# Patient Record
Sex: Male | Born: 1980 | Race: Black or African American | Hispanic: No | Marital: Single | Smoking: Former smoker
Health system: Southern US, Community
[De-identification: ages and names within clinical notes are randomized; demographics above are authoritative.]

## PROBLEM LIST (undated history)

## (undated) DIAGNOSIS — F419 Anxiety disorder, unspecified: Secondary | ICD-10-CM

## (undated) DIAGNOSIS — G4730 Sleep apnea, unspecified: Secondary | ICD-10-CM

## (undated) DIAGNOSIS — F32A Depression, unspecified: Secondary | ICD-10-CM

## (undated) DIAGNOSIS — K219 Gastro-esophageal reflux disease without esophagitis: Secondary | ICD-10-CM

## (undated) DIAGNOSIS — F1020 Alcohol dependence, uncomplicated: Secondary | ICD-10-CM

## (undated) HISTORY — PX: NO PAST SURGERIES: SHX2092

## (undated) HISTORY — DX: Alcohol dependence, uncomplicated: F10.20

## (undated) HISTORY — DX: Anxiety disorder, unspecified: F41.9

## (undated) HISTORY — DX: Sleep apnea, unspecified: G47.30

## (undated) HISTORY — DX: Depression, unspecified: F32.A

---

## 2003-02-07 ENCOUNTER — Emergency Department (HOSPITAL_COMMUNITY): Admission: EM | Admit: 2003-02-07 | Discharge: 2003-02-07 | Payer: Self-pay | Admitting: Emergency Medicine

## 2003-02-07 ENCOUNTER — Encounter: Payer: Self-pay | Admitting: Emergency Medicine

## 2003-04-28 ENCOUNTER — Encounter: Payer: Self-pay | Admitting: Emergency Medicine

## 2003-04-28 ENCOUNTER — Emergency Department (HOSPITAL_COMMUNITY): Admission: EM | Admit: 2003-04-28 | Discharge: 2003-04-28 | Payer: Self-pay | Admitting: Emergency Medicine

## 2003-07-03 ENCOUNTER — Emergency Department (HOSPITAL_COMMUNITY): Admission: AD | Admit: 2003-07-03 | Discharge: 2003-07-03 | Payer: Self-pay | Admitting: Emergency Medicine

## 2005-01-04 ENCOUNTER — Emergency Department (HOSPITAL_COMMUNITY): Admission: EM | Admit: 2005-01-04 | Discharge: 2005-01-04 | Payer: Self-pay | Admitting: Family Medicine

## 2005-10-31 ENCOUNTER — Emergency Department (HOSPITAL_COMMUNITY): Admission: EM | Admit: 2005-10-31 | Discharge: 2005-10-31 | Payer: Self-pay | Admitting: Emergency Medicine

## 2006-02-14 ENCOUNTER — Emergency Department (HOSPITAL_COMMUNITY): Admission: EM | Admit: 2006-02-14 | Discharge: 2006-02-14 | Payer: Self-pay | Admitting: Emergency Medicine

## 2007-01-04 ENCOUNTER — Emergency Department (HOSPITAL_COMMUNITY): Admission: EM | Admit: 2007-01-04 | Discharge: 2007-01-04 | Payer: Self-pay | Admitting: *Deleted

## 2009-06-26 ENCOUNTER — Emergency Department (HOSPITAL_COMMUNITY): Admission: EM | Admit: 2009-06-26 | Discharge: 2009-06-26 | Payer: Self-pay | Admitting: Emergency Medicine

## 2010-01-23 ENCOUNTER — Emergency Department (HOSPITAL_COMMUNITY): Admission: EM | Admit: 2010-01-23 | Discharge: 2010-01-23 | Payer: Self-pay | Admitting: Emergency Medicine

## 2010-01-24 ENCOUNTER — Emergency Department (HOSPITAL_COMMUNITY): Admission: EM | Admit: 2010-01-24 | Discharge: 2010-01-24 | Payer: Self-pay | Admitting: Emergency Medicine

## 2010-11-19 LAB — CBC
HCT: 46.5 % (ref 39.0–52.0)
Hemoglobin: 15.6 g/dL (ref 13.0–17.0)
MCHC: 33.5 g/dL (ref 30.0–36.0)
MCV: 84.5 fL (ref 78.0–100.0)
Platelets: 200 10*3/uL (ref 150–400)
RBC: 5.49 MIL/uL (ref 4.22–5.81)
RDW: 12.5 % (ref 11.5–15.5)
WBC: 9.9 10*3/uL (ref 4.0–10.5)

## 2010-11-19 LAB — DIFFERENTIAL
Basophils Absolute: 0 10*3/uL (ref 0.0–0.1)
Basophils Relative: 0 % (ref 0–1)
Eosinophils Absolute: 0.1 10*3/uL (ref 0.0–0.7)
Eosinophils Relative: 1 % (ref 0–5)
Lymphocytes Relative: 17 % (ref 12–46)
Lymphs Abs: 1.7 10*3/uL (ref 0.7–4.0)
Monocytes Absolute: 0.4 10*3/uL (ref 0.1–1.0)
Monocytes Relative: 4 % (ref 3–12)
Neutro Abs: 7.6 10*3/uL (ref 1.7–7.7)
Neutrophils Relative %: 78 % — ABNORMAL HIGH (ref 43–77)

## 2010-11-19 LAB — URINALYSIS, ROUTINE W REFLEX MICROSCOPIC
Bilirubin Urine: NEGATIVE
Glucose, UA: NEGATIVE mg/dL
Hgb urine dipstick: NEGATIVE
Ketones, ur: 15 mg/dL — AB
Nitrite: NEGATIVE
Protein, ur: NEGATIVE mg/dL
Specific Gravity, Urine: 1.028 (ref 1.005–1.030)
Urobilinogen, UA: 0.2 mg/dL (ref 0.0–1.0)
pH: 5 (ref 5.0–8.0)

## 2011-12-27 ENCOUNTER — Encounter (HOSPITAL_COMMUNITY): Payer: Self-pay | Admitting: *Deleted

## 2011-12-27 ENCOUNTER — Emergency Department (HOSPITAL_COMMUNITY)
Admission: EM | Admit: 2011-12-27 | Discharge: 2011-12-27 | Discharge status: Absent without leave | Payer: Self-pay | Source: Home / Self Care

## 2011-12-27 ENCOUNTER — Emergency Department (HOSPITAL_COMMUNITY)
Admission: EM | Admit: 2011-12-27 | Discharge: 2011-12-27 | Disposition: A | Discharge status: Home / Self Care | Payer: Self-pay | Attending: Emergency Medicine | Admitting: Emergency Medicine

## 2011-12-27 DIAGNOSIS — R1013 Epigastric pain: Secondary | ICD-10-CM | POA: Insufficient documentation

## 2011-12-27 DIAGNOSIS — K2990 Gastroduodenitis, unspecified, without bleeding: Secondary | ICD-10-CM | POA: Insufficient documentation

## 2011-12-27 DIAGNOSIS — K2970 Gastritis, unspecified, without bleeding: Secondary | ICD-10-CM | POA: Insufficient documentation

## 2011-12-27 LAB — CBC
HCT: 43.3 % (ref 39.0–52.0)
Hemoglobin: 14.5 g/dL (ref 13.0–17.0)
MCHC: 33.5 g/dL (ref 30.0–36.0)
RBC: 5.19 MIL/uL (ref 4.22–5.81)
RDW: 12.8 % (ref 11.5–15.5)

## 2011-12-27 LAB — URINALYSIS, ROUTINE W REFLEX MICROSCOPIC
Protein, ur: NEGATIVE mg/dL
Urobilinogen, UA: 1 mg/dL (ref 0.0–1.0)

## 2011-12-27 LAB — COMPREHENSIVE METABOLIC PANEL
CO2: 25 mEq/L (ref 19–32)
Calcium: 9.5 mg/dL (ref 8.4–10.5)
Chloride: 104 mEq/L (ref 96–112)
Creatinine, Ser: 1.11 mg/dL (ref 0.50–1.35)
GFR calc Af Amer: >90 mL/min (ref >90)
GFR calc non Af Amer: 88 mL/min — ABNORMAL LOW (ref >90)
Glucose, Bld: 102 mg/dL — ABNORMAL HIGH (ref 70–99)
Sodium: 139 mEq/L (ref 135–145)

## 2011-12-27 LAB — DIFFERENTIAL
Basophils Absolute: 0.1 10*3/uL (ref 0.0–0.1)
Lymphocytes Relative: 33 % (ref 12–46)
Lymphs Abs: 2.8 10*3/uL (ref 0.7–4.0)
Neutro Abs: 4.8 10*3/uL (ref 1.7–7.7)
Neutrophils Relative %: 56 % (ref 43–77)

## 2011-12-27 LAB — URINE MICROSCOPIC-ADD ON

## 2011-12-27 LAB — LIPASE, BLOOD: Lipase: 64 U/L — ABNORMAL HIGH (ref 11–59)

## 2011-12-27 MED ORDER — OMEPRAZOLE 20 MG PO CPDR: 20.0000 mg | DELAYED_RELEASE_CAPSULE | Freq: Every day | ORAL | Status: DC

## 2011-12-27 MED ORDER — FUROSEMIDE 40 MG PO TABS: 40.0000 mg | ORAL_TABLET | Freq: Every day | ORAL | Status: DC

## 2011-12-27 NOTE — Discharge Instructions (Signed)
Gastritis Gastritis is an inflammation (the body's way of reacting to injury and/or infection) of the stomach. It is often caused by viral or bacterial (germ) infections. It can also be caused by chemicals (including alcohol) and medications. This illness may be associated with generalized malaise (feeling tired, not well), cramps, and fever. The illness may last 2 to 7 days. If symptoms of gastritis continue, gastroscopy (looking into the stomach with a telescope-like instrument), biopsy (taking tissue samples), and/or blood tests may be necessary to determine the cause. Antibiotics will not affect the illness unless there is a bacterial infection present. One common bacterial cause of gastritis is an organism known as H. Pylori. This can be treated with antibiotics. Other forms of gastritis are caused by too much acid in the stomach. They can be treated with medications such as H2 blockers and antacids. Home treatment is usually all that is needed. Young children will quickly become dehydrated (loss of body fluids) if vomiting and diarrhea are both present. Medications may be given to control nausea. Medications are usually not given for diarrhea unless especially bothersome. Some medications slow the removal of the virus from the gastrointestinal tract. This slows down the healing process. HOME CARE INSTRUCTIONS Home care instructions for nausea and vomiting:  For adults: drink small amounts of fluids often. Drink at least 2 quarts a day. Take sips frequently. Do not drink large amounts of fluid at one time. This may worsen the nausea.   Only take over-the-counter or prescription medicines for pain, discomfort, or fever as directed by your caregiver.   Drink clear liquids only. Those are anything you can see through such as water, broth, or soft drinks.   Once you are keeping clear liquids down, you may start full liquids, soups, juices, and ice cream or sherbet. Slowly add bland (plain, not spicy)  foods to your diet.  Home care instructions for diarrhea:  Diarrhea can be caused by bacterial infections or a virus. Your condition should improve with time, rest, fluids, and/or anti-diarrheal medication.   Until your diarrhea is under control, you should drink clear liquids often in small amounts. Clear liquids include: water, broth, jell-o water and weak tea.  Avoid:  Milk.   Fruits.   Tobacco.   Alcohol.   Extremely hot or cold fluids.   Too much intake of anything at one time.  When your diarrhea stops you may add the following foods, which help the stool to become more formed:  Rice.   Bananas.   Apples without skin.   Dry toast.  Once these foods are tolerated you may add low-fat yogurt and low-fat cottage cheese. They will help to restore the normal bacterial balance in your bowel. Wash your hands well to avoid spreading bacteria (germ) or virus. SEEK IMMEDIATE MEDICAL CARE IF:   You are unable to keep fluids down.   Vomiting or diarrhea become persistent (constant).   Abdominal pain develops, increases, or localizes. (Right sided pain can be appendicitis. Left sided pain in adults can be diverticulitis.)   You develop a fever (an oral temperature above 102 F (38.9 C)).   Diarrhea becomes excessive or contains blood or mucus.   You have excessive weakness, dizziness, fainting or extreme thirst.   You are not improving or you are getting worse.   You have any other questions or concerns.  Document Released: 08/13/2001 Document Revised: 08/08/2011 Document Reviewed: 08/19/2005 Exit[employer] Patient Information 2012 [employer]. 

## 2011-12-27 NOTE — ED Provider Notes (Signed)
History     CSN: [id number]  Arrival date & time 12/27/11  1807   First MD Initiated Contact with Patient 12/27/11 2215      Chief Complaint  Patient presents with  . Abdominal Pain    (Consider location/radiation/quality/duration/timing/severity/associated sxs/prior treatment) The history is provided by the patient.   the patient's been having epigastric abdominal pain for several months.  Reports his discomfort worsen over the past 3-4 days.  His radiation towards his back.  He one episode of vomiting.  He denies hematemesis.  He denies melena or hematochezia.  He reports his symptoms are worsened by spicy foods and by drinking beer.  The symptoms are sometimes worsened by lying back.  He reports his pain feels similar to a burning sensation.  He reports "it seems raw".  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  The patient is currently trying Zantac at home and has not had any improvement in his symptoms.  He denies nausea and vomiting at this time.  He reports his discomfort in his abdomen is not present currently as it is there only there when he eats.  His symptoms are mild  History reviewed. No pertinent past medical history.  History reviewed. No pertinent past surgical history.  No family history on file.  History  Substance Use Topics  . Smoking status: Current Everyday Smoker  . Smokeless tobacco: Not on file  . Alcohol Use: Yes      Review of Systems  Gastrointestinal: Positive for abdominal pain.  All other systems reviewed and are negative.    Allergies  Review of patient's allergies indicates no known allergies.  Home Medications   Current Outpatient Rx  Name Route Sig Dispense Refill  . RANITIDINE HCL 150 MG PO TABS Oral Take 150 mg by mouth every morning.    . TETRAHYDROZOLINE HCL 0.05 % OP SOLN Both Eyes Place 2 drops into both eyes daily as needed. For allergies/itchy eyes    . OMEPRAZOLE 20 MG PO CPDR Oral Take 1 capsule (20  mg total) by mouth daily. 30 capsule 1    BP 131/78  Pulse 69  Temp(Src) 98.7 F (37.1 C) (Oral)  Resp 18  SpO2 100%  Physical Exam  Nursing note and vitals reviewed. Constitutional: He is oriented to person, place, and time. He appears well-developed and well-nourished.  HENT:  Head: Normocephalic and atraumatic.  Eyes: EOM are normal.  Neck: Normal range of motion.  Cardiovascular: Normal rate, regular rhythm, normal heart sounds and intact distal pulses.   Pulmonary/Chest: Effort normal and breath sounds normal. No respiratory distress.  Abdominal: Soft. He exhibits no distension. There is no tenderness.  Musculoskeletal: Normal range of motion.  Neurological: He is alert and oriented to person, place, and time.  Skin: Skin is warm and dry.  Psychiatric: He has a normal mood and affect. Judgment normal.    ED Course  Procedures (including critical care time)  Labs Reviewed  URINALYSIS, ROUTINE W REFLEX MICROSCOPIC - Abnormal; Notable for the following:    Leukocytes, UA SMALL (*)    All other components within normal limits  COMPREHENSIVE METABOLIC PANEL - Abnormal; Notable for the following:    Glucose, Bld 102 (*)    GFR calc non Af Amer 88 (*)    All other components within normal limits  LIPASE, BLOOD - Abnormal; Notable for the following:    Lipase 64 (*)    All other components within normal  limits  CBC  DIFFERENTIAL  URINE MICROSCOPIC-ADD ON   No results found.   1. Gastritis       MDM  The patient appears to have a severe gastritis.  I don't believe he has a perforation at this time.  His abdomen is benign.  His vital signs are normal.  Have recommended that the patient stopped eating significantly spicy foods or anything that irritates his stomach.  I've also recommended he abstain from alcohol use as this is likely cause to severe gastritis.  Hopeless the patient on daily Prilosec.  He's been given GI referral.  He understands to return to the ER for  new or worsening symptoms        Lyanne Co, MD 12/27/11 2333

## 2011-12-27 NOTE — ED Notes (Signed)
Patient with burning sensation after he eats spicy foods.  Patient states he has had the burning sensation for 4 days.  Patient has vomited a few times in last few days.  No diarrhea.  Patient states he does take an OTC pill for acid reflux.

## 2011-12-27 NOTE — ED Notes (Signed)
abd pain for 3-4 days no nv or diarrhea

## 2012-04-12 ENCOUNTER — Other Ambulatory Visit: Payer: Self-pay

## 2012-04-12 ENCOUNTER — Emergency Department (HOSPITAL_COMMUNITY)
Admission: EM | Admit: 2012-04-12 | Discharge: 2012-04-12 | Disposition: A | Discharge status: Home / Self Care | Payer: Self-pay | Attending: Emergency Medicine | Admitting: Emergency Medicine

## 2012-04-12 ENCOUNTER — Emergency Department (HOSPITAL_COMMUNITY): Payer: Self-pay

## 2012-04-12 ENCOUNTER — Encounter (HOSPITAL_COMMUNITY): Payer: Self-pay | Admitting: *Deleted

## 2012-04-12 DIAGNOSIS — R0789 Other chest pain: Secondary | ICD-10-CM | POA: Insufficient documentation

## 2012-04-12 DIAGNOSIS — R109 Unspecified abdominal pain: Secondary | ICD-10-CM

## 2012-04-12 DIAGNOSIS — R1011 Right upper quadrant pain: Secondary | ICD-10-CM | POA: Insufficient documentation

## 2012-04-12 DIAGNOSIS — K219 Gastro-esophageal reflux disease without esophagitis: Secondary | ICD-10-CM | POA: Insufficient documentation

## 2012-04-12 DIAGNOSIS — F17200 Nicotine dependence, unspecified, uncomplicated: Secondary | ICD-10-CM | POA: Insufficient documentation

## 2012-04-12 LAB — POCT I-STAT TROPONIN I: Troponin i, poc: 0.01 ng/mL (ref 0.00–0.08)

## 2012-04-12 LAB — POCT I-STAT, CHEM 8
BUN: 13 mg/dL (ref 6–23)
Chloride: 104 mEq/L (ref 96–112)
Hemoglobin: 17 g/dL (ref 13.0–17.0)
Potassium: 4.3 mEq/L (ref 3.5–5.1)
Sodium: 139 mEq/L (ref 135–145)
TCO2: 26 mmol/L (ref 0–100)

## 2012-04-12 MED ORDER — OMEPRAZOLE 20 MG PO CPDR: 40.0000 mg | DELAYED_RELEASE_CAPSULE | Freq: Every day | ORAL | Status: DC

## 2012-04-12 NOTE — ED Notes (Signed)
Pt presents to department for evaluation of abdominal pain, acid reflux and diarrhea. Ongoing x2 years. Pt takes prilosec, but states no relief with indigestion. Also states intermittent diarrhea and vomiting. States "I don't think I am digesting my food correctly." pt c/o chest discomfort and burning, rating 8/10 at the time. Abdomen soft and non tender to palpation. Bowel sounds present all quadrants. He is alert and oriented x4. No signs of acute distress noted at the time.

## 2012-04-12 NOTE — ED Provider Notes (Signed)
History     CSN: [id number]  Arrival date & time 04/12/12  1516   First MD Initiated Contact with Patient 04/12/12 1823      Chief Complaint  Patient presents with  . Chest Pain    (Consider location/radiation/quality/duration/timing/severity/associated sxs/prior treatment) HPI Comments: Patient reports a 3 month history of postprandial RUQ pain. He reports having pain about 30 min after he eats and then getting the urge to vomit and sometimes does vomit. He describes the pain as achy and moderate. He has a history of acid reflux, for which he takes omeprazole but he recently ran out. He gets relief from the omeprazole. He reports associated NVD. He denies recent illness. He was seeing a doctor at [facility] which recently closed down.   Patient is a 31 y.o. male presenting with chest pain.  Chest Pain Primary symptoms include shortness of breath, cough, abdominal pain, nausea and vomiting. Pertinent negatives for primary symptoms include no fever, no fatigue and no dizziness.  Pertinent negatives for associated symptoms include no diaphoresis, no numbness and no weakness.     History reviewed. No pertinent past medical history.  History reviewed. No pertinent past surgical history.  No family history on file.  History  Substance Use Topics  . Smoking status: Current Everyday Smoker  . Smokeless tobacco: Not on file  . Alcohol Use: Yes      Review of Systems  Constitutional: Negative for fever, chills, diaphoresis and fatigue.  Respiratory: Positive for cough, chest tightness and shortness of breath.   Cardiovascular: Positive for chest pain.  Gastrointestinal: Positive for nausea, vomiting, abdominal pain and diarrhea. Negative for constipation and abdominal distention.  Genitourinary: Negative for dysuria and difficulty urinating.  Musculoskeletal: Negative for back pain.  Skin: Negative for rash and wound.  Neurological: Negative for dizziness, weakness,  light-headedness, numbness and headaches.    Allergies  Review of patient's allergies indicates no known allergies.  Home Medications   Current Outpatient Rx  Name Route Sig Dispense Refill  . OMEPRAZOLE 20 MG PO CPDR Oral Take 1 capsule (20 mg total) by mouth daily. 30 capsule 1    BP 133/82  Pulse 55  Temp 98 F (36.7 C) (Oral)  Resp 16  SpO2 100%  Physical Exam  Nursing note and vitals reviewed. Constitutional: He is oriented to person, place, and time. He appears well-developed and well-nourished. No distress.  HENT:  Head: Normocephalic and atraumatic.  Eyes: Conjunctivae are normal. Pupils are equal, round, and reactive to light. No scleral icterus.  Neck: Normal range of motion.  Cardiovascular: Normal rate and regular rhythm.  Exam reveals no gallop and no friction rub.   No murmur heard. Pulmonary/Chest: Effort normal and breath sounds normal.  Abdominal: Soft. Bowel sounds are normal. He exhibits no distension. There is no tenderness. There is no rebound and no guarding.  Musculoskeletal: Normal range of motion.  Neurological: He is alert and oriented to person, place, and time.  Skin: Skin is warm and dry. He is not diaphoretic.  Psychiatric: He has a normal mood and affect. His behavior is normal.    ED Course  Procedures (including critical care time)  Labs Reviewed  POCT I-STAT, CHEM 8 - Abnormal; Notable for the following:    Calcium, Ion 1.28 (*)     All other components within normal limits  POCT I-STAT TROPONIN I  URINALYSIS, ROUTINE W REFLEX MICROSCOPIC   Dg Chest 2 View  04/12/2012  *RADIOLOGY REPORT*  Clinical Data: Chest pain  CHEST - 2 VIEW  Comparison: 01/23/2010  Findings: Lungs are clear.  No pleural effusion or pneumothorax.  Cardiomediastinal silhouette is within normal limits.  Visualized osseous structures are within normal limits.  IMPRESSION: Normal chest radiographs.  Original Report Authenticated By: Charline Bills, M.D.     No  diagnosis found.    MDM  6:52 PM I spent time with the patient and his girlfriend explaining his condition. I told them about acid reflux, possibility of biliary colic, gastric/duodenal ulcer are all possibilities to explain his discomfort. His labs are normal and physical exam was unremarkable. He will ultimately have to be managed as an outpatient and change his diet. I will attach information regarding his symptoms and a list of low-cost medical providers in the area for further evaluation. I will also refill his omeprazole. Patient and Dr. Weldon Inches are agreeable to this plan.         Emilia Beck, PA-C 04/12/12 1900

## 2012-04-12 NOTE — ED Notes (Signed)
Pt resting quietly at the time. Unable to void at present. Vital signs stable. No signs of distress noted.

## 2012-04-12 NOTE — ED Notes (Signed)
Pt has been having abdominal pain and reports trying to eat salad and then went to bathroom and salad coming out of him whole.  Chest pain to mid chest and no radiation.

## 2012-04-13 NOTE — ED Provider Notes (Signed)
Medical screening examination/treatment/procedure(s) were conducted as a shared visit with non-physician practitioner(s) and myself.  I personally evaluated the patient during the encounter  Lloyd Ayo, MD 04/13/12 0708 

## 2013-07-30 ENCOUNTER — Encounter (HOSPITAL_COMMUNITY): Payer: Self-pay | Admitting: Emergency Medicine

## 2013-07-30 ENCOUNTER — Emergency Department (HOSPITAL_COMMUNITY)
Admission: EM | Admit: 2013-07-30 | Discharge: 2013-07-30 | Disposition: A | Discharge status: Home / Self Care | Payer: Self-pay | Attending: Emergency Medicine | Admitting: Emergency Medicine

## 2013-07-30 DIAGNOSIS — F17200 Nicotine dependence, unspecified, uncomplicated: Secondary | ICD-10-CM | POA: Insufficient documentation

## 2013-07-30 DIAGNOSIS — K089 Disorder of teeth and supporting structures, unspecified: Secondary | ICD-10-CM | POA: Insufficient documentation

## 2013-07-30 DIAGNOSIS — K0889 Other specified disorders of teeth and supporting structures: Secondary | ICD-10-CM

## 2013-07-30 MED ORDER — PENICILLIN V POTASSIUM 500 MG PO TABS: 500.0000 mg | ORAL_TABLET | Freq: Four times a day (QID) | ORAL | Status: AC

## 2013-07-30 MED ORDER — OXYCODONE-ACETAMINOPHEN 5-325 MG PO TABS
2.0000 | ORAL_TABLET | Freq: Once | ORAL | Status: AC
  Administered 2013-07-30: 2 via ORAL
  Filled 2013-07-30: qty 2

## 2013-07-30 MED ORDER — OXYCODONE-ACETAMINOPHEN 5-325 MG PO TABS: 2.0000 | ORAL_TABLET | ORAL | Status: DC | PRN

## 2013-07-30 NOTE — ED Notes (Signed)
Pt c/o right lower dental pain x 2 weeks

## 2013-07-30 NOTE — ED Provider Notes (Signed)
CSN: [id number]     Arrival date & time 07/30/13  1802 History   First MD Initiated Contact with Patient 07/30/13 1810    This chart was scribed for Irish Elders NP, a non-physician practitioner working with Gavin Pound. Oletta Lamas, MD by Lewanda Rife, ED Scribe. This patient was seen in room TR06C/TR06C and the patient's care was started at 6:17 PM     Chief Complaint  Patient presents with  . Dental Pain   (Consider location/radiation/quality/duration/timing/severity/associated sxs/prior Treatment) The history is provided by the patient. No language interpreter was used.   HPI Comments: Juan Esparza is a 32 y.o. male who presents to the Emergency Department complaining of constant worsening RLQ dental pain onset 2 weeks. Reports pain is exacerbated by touch. Denies any alleviating factors. Reports trying Tylenol with no relief of symptoms. Denies associated injury to teeth or face, fever, neck pain, dysphagia, recent illness, sore throat, difficulty breathing, and neck stiffness.  History reviewed. No pertinent past medical history. History reviewed. No pertinent past surgical history. History reviewed. No pertinent family history. History  Substance Use Topics  . Smoking status: Current Every Day Smoker  . Smokeless tobacco: Not on file  . Alcohol Use: Yes    Review of Systems  Constitutional: Negative for fever.  HENT: Positive for dental problem.   All other systems reviewed and are negative.   A complete 10 system review of systems was obtained and all systems are negative except as noted in the HPI and PMHx.     Allergies  Review of patient's allergies indicates no known allergies.  Home Medications   Current Outpatient Rx  Name  Route  Sig  Dispense  Refill  . EXPIRED: omeprazole (PRILOSEC) 20 MG capsule   Oral   Take 1 capsule (20 mg total) by mouth daily.   30 capsule   1   . EXPIRED: omeprazole (PRILOSEC) 20 MG capsule   Oral   Take 2 capsules (40 mg  total) by mouth daily.   30 capsule   1    BP 126/87  Pulse 83  Temp(Src) 98.5 F (36.9 C) (Oral)  Resp 19  SpO2 99% Physical Exam  Nursing note and vitals reviewed. Constitutional: He is oriented to person, place, and time. He appears well-developed and well-nourished. No distress.  HENT:  Head: Normocephalic and atraumatic.  Mouth/Throat: Uvula is midline and mucous membranes are normal. No trismus in the jaw. No dental caries. No oropharyngeal exudate, posterior oropharyngeal edema or posterior oropharyngeal erythema.    Eyes: EOM are normal.  Neck: Normal range of motion and full passive range of motion without pain. Neck supple. No rigidity. No tracheal deviation present.  No nuchal rigidity  Cardiovascular: Normal rate.   Pulmonary/Chest: Effort normal. No respiratory distress.  Musculoskeletal: Normal range of motion.  Lymphadenopathy:    He has no cervical adenopathy.  Neurological: He is alert and oriented to person, place, and time.  Skin: Skin is warm and dry.  Psychiatric: He has a normal mood and affect. His behavior is normal.    ED Course  Procedures (including critical care time) Labs Review Labs Reviewed - No data to display Imaging Review No results found.  EKG Interpretation   None       MDM   1. Toothache    Right lower jaw pain. Erythema and gingiva edema around posterior molar. No external jaw or neck swelling. No lymphadenopathy associated. Denies fever, chills or difficulty swallowing. Pen-VK as prescribed and percocet  for break through pain. Dentist follow-up info given.   I personally performed the services described in this documentation, which was scribed in my presence. The recorded information has been reviewed and is accurate.    Irish Elders, NP 07/30/13 2007

## 2013-07-30 NOTE — ED Provider Notes (Signed)
Medical screening examination/treatment/procedure(s) were performed by non-physician practitioner and as supervising physician I was immediately available for consultation/collaboration.  EKG Interpretation   None         Aliah Eriksson Y. Carin Shipp, MD 07/30/13 2038 

## 2013-08-17 ENCOUNTER — Emergency Department (INDEPENDENT_AMBULATORY_CARE_PROVIDER_SITE_OTHER)
Admission: EM | Admit: 2013-08-17 | Discharge: 2013-08-17 | Disposition: A | Discharge status: Home / Self Care | Payer: Self-pay | Source: Home / Self Care

## 2013-08-17 ENCOUNTER — Encounter (HOSPITAL_COMMUNITY): Payer: Self-pay | Admitting: Emergency Medicine

## 2013-08-17 ENCOUNTER — Emergency Department (HOSPITAL_COMMUNITY)
Admission: EM | Admit: 2013-08-17 | Discharge: 2013-08-17 | Discharge status: Against Medical Advice | Payer: Self-pay | Attending: Emergency Medicine | Admitting: Emergency Medicine

## 2013-08-17 DIAGNOSIS — Z0000 Encounter for general adult medical examination without abnormal findings: Secondary | ICD-10-CM | POA: Insufficient documentation

## 2013-08-17 DIAGNOSIS — K089 Disorder of teeth and supporting structures, unspecified: Secondary | ICD-10-CM

## 2013-08-17 DIAGNOSIS — K219 Gastro-esophageal reflux disease without esophagitis: Secondary | ICD-10-CM | POA: Insufficient documentation

## 2013-08-17 DIAGNOSIS — F17200 Nicotine dependence, unspecified, uncomplicated: Secondary | ICD-10-CM | POA: Insufficient documentation

## 2013-08-17 DIAGNOSIS — K0889 Other specified disorders of teeth and supporting structures: Secondary | ICD-10-CM

## 2013-08-17 MED ORDER — HYDROCODONE-ACETAMINOPHEN 7.5-325 MG PO TABS: 1.0000 | ORAL_TABLET | ORAL | Status: DC | PRN

## 2013-08-17 MED ORDER — PENICILLIN V POTASSIUM 500 MG PO TABS: 500.0000 mg | ORAL_TABLET | Freq: Four times a day (QID) | ORAL | Status: DC

## 2013-08-17 NOTE — ED Provider Notes (Signed)
Medical screening examination/treatment/procedure(s) were performed by non-physician practitioner and as supervising physician I was immediately available for consultation/collaboration.  Leslee Home, M.D.  Reuben Likes, MD 08/17/13 2133

## 2013-08-17 NOTE — ED Notes (Signed)
Pt called in main ED waiting area with no response; triage RN notified

## 2013-08-17 NOTE — ED Notes (Signed)
Pt did not answer x 1 

## 2013-08-17 NOTE — ED Notes (Signed)
Pt c/o persistent dental pain onset 3 days w/some swelling Denies: f/v/n/d. Pt was seen at [facility] ED on 11/28 for same sxs... Given Percocet's  He is alert w/no signs of acute distress.

## 2013-08-17 NOTE — ED Notes (Signed)
Pt called x 3 with no answer

## 2013-08-17 NOTE — ED Provider Notes (Signed)
CSN: [id number]     Arrival date & time 08/17/13  1541 History   First MD Initiated Contact with Patient 08/17/13 1742     Chief Complaint  Patient presents with  . Dental Pain   (Consider location/radiation/quality/duration/timing/severity/associated sxs/prior Treatment) HPI Comments: As above, C/O toothache same as he presented to the ED for on 11/28. Has not found a dentist he can afford; received information on dentists at that visit. Points to the R lower 3rd molar.  Minor conjunctival redness and swelling. Minor dental tenderness.   History reviewed. No pertinent past medical history. History reviewed. No pertinent past surgical history. No family history on file. History  Substance Use Topics  . Smoking status: Current Every Day Smoker  . Smokeless tobacco: Not on file  . Alcohol Use: Yes    Review of Systems  All other systems reviewed and are negative.    Allergies  Review of patient's allergies indicates no known allergies.  Home Medications   Current Outpatient Rx  Name  Route  Sig  Dispense  Refill  . HYDROcodone-acetaminophen (NORCO) 7.5-325 MG per tablet   Oral   Take 1 tablet by mouth every 4 (four) hours as needed.   15 tablet   0   . oxyCODONE-acetaminophen (PERCOCET/ROXICET) 5-325 MG per tablet   Oral   Take 2 tablets by mouth every 4 (four) hours as needed for severe pain.   6 tablet   0   . penicillin v potassium (VEETID) 500 MG tablet   Oral   Take 1 tablet (500 mg total) by mouth 4 (four) times daily. X 10 days   40 tablet   0    BP 156/86  Pulse 63  Temp(Src) 98.8 F (37.1 C) (Oral)  Resp 16  SpO2 99% Physical Exam  Nursing note and vitals reviewed. Constitutional: He is oriented to person, place, and time. He appears well-developed and well-nourished. No distress.  HENT:  Nose: Nose normal.  Mouth/Throat: Oropharynx is clear and moist.  As per HPI  Neck: Normal range of motion. Neck supple.  Pulmonary/Chest: No respiratory  distress.  Lymphadenopathy:    He has no cervical adenopathy.  Neurological: He is alert and oriented to person, place, and time.  Skin: Skin is warm and dry.  Psychiatric: He has a normal mood and affect.    ED Course  Procedures (including critical care time) Labs Review Labs Reviewed - No data to display Imaging Review No results found.    MDM   1. Pain, dental    Pen vk 500 qid Norco 7.5 q 4h prn  #15 Must find dentist per info previously given.    Hayden Rasmussen, NP 08/17/13 [phone]

## 2013-08-19 ENCOUNTER — Emergency Department (HOSPITAL_COMMUNITY)
Admission: EM | Admit: 2013-08-19 | Discharge: 2013-08-19 | Discharge status: Against Medical Advice | Payer: Self-pay | Attending: Emergency Medicine | Admitting: Emergency Medicine

## 2013-08-19 ENCOUNTER — Encounter (HOSPITAL_COMMUNITY): Payer: Self-pay | Admitting: Emergency Medicine

## 2013-08-19 DIAGNOSIS — F17200 Nicotine dependence, unspecified, uncomplicated: Secondary | ICD-10-CM | POA: Insufficient documentation

## 2013-08-19 DIAGNOSIS — Z8719 Personal history of other diseases of the digestive system: Secondary | ICD-10-CM | POA: Insufficient documentation

## 2013-08-19 DIAGNOSIS — R1013 Epigastric pain: Secondary | ICD-10-CM | POA: Insufficient documentation

## 2013-08-19 HISTORY — DX: Gastro-esophageal reflux disease without esophagitis: K21.9

## 2013-08-19 LAB — CBC WITH DIFFERENTIAL/PLATELET
Basophils Absolute: 0.1 10*3/uL (ref 0.0–0.1)
Basophils Relative: 1 % (ref 0–1)
HCT: 43 % (ref 39.0–52.0)
Hemoglobin: 14.6 g/dL (ref 13.0–17.0)
Lymphocytes Relative: 40 % (ref 12–46)
MCHC: 34 g/dL (ref 30.0–36.0)
Monocytes Absolute: 0.3 10*3/uL (ref 0.1–1.0)
Monocytes Relative: 5 % (ref 3–12)
Neutro Abs: 3.5 10*3/uL (ref 1.7–7.7)
Neutrophils Relative %: 52 % (ref 43–77)
RDW: 14.8 % (ref 11.5–15.5)
WBC: 6.8 10*3/uL (ref 4.0–10.5)

## 2013-08-19 LAB — URINALYSIS, ROUTINE W REFLEX MICROSCOPIC
Bilirubin Urine: NEGATIVE
Glucose, UA: NEGATIVE mg/dL
Hgb urine dipstick: NEGATIVE
Ketones, ur: NEGATIVE mg/dL
Protein, ur: NEGATIVE mg/dL
Urobilinogen, UA: 0.2 mg/dL (ref 0.0–1.0)

## 2013-08-19 LAB — COMPREHENSIVE METABOLIC PANEL
AST: 32 U/L (ref 0–37)
Albumin: 4.5 g/dL (ref 3.5–5.2)
Alkaline Phosphatase: 51 U/L (ref 39–117)
CO2: 22 mEq/L (ref 19–32)
Chloride: 98 mEq/L (ref 96–112)
Creatinine, Ser: 0.87 mg/dL (ref 0.50–1.35)
GFR calc non Af Amer: >90 mL/min (ref >90)
Potassium: 4.1 mEq/L (ref 3.5–5.1)
Total Bilirubin: 0.5 mg/dL (ref 0.3–1.2)

## 2013-08-19 NOTE — ED Notes (Signed)
No answer when called 

## 2013-08-19 NOTE — ED Notes (Signed)
Called pt twice for room no answer

## 2013-08-19 NOTE — ED Notes (Signed)
Pt arrived by ems, reports onset this afternoon of sharp epigastric pain. Denies any n/v/d or sob. Hx of gerd.

## 2014-03-26 IMAGING — CR DG CHEST 2V
2 series · 2 of 2 positions shown · non-contrast
Comparison: 01/23/2010

CLINICAL DATA: Chest pain

CHEST - 2 VIEW

[w chest pa]
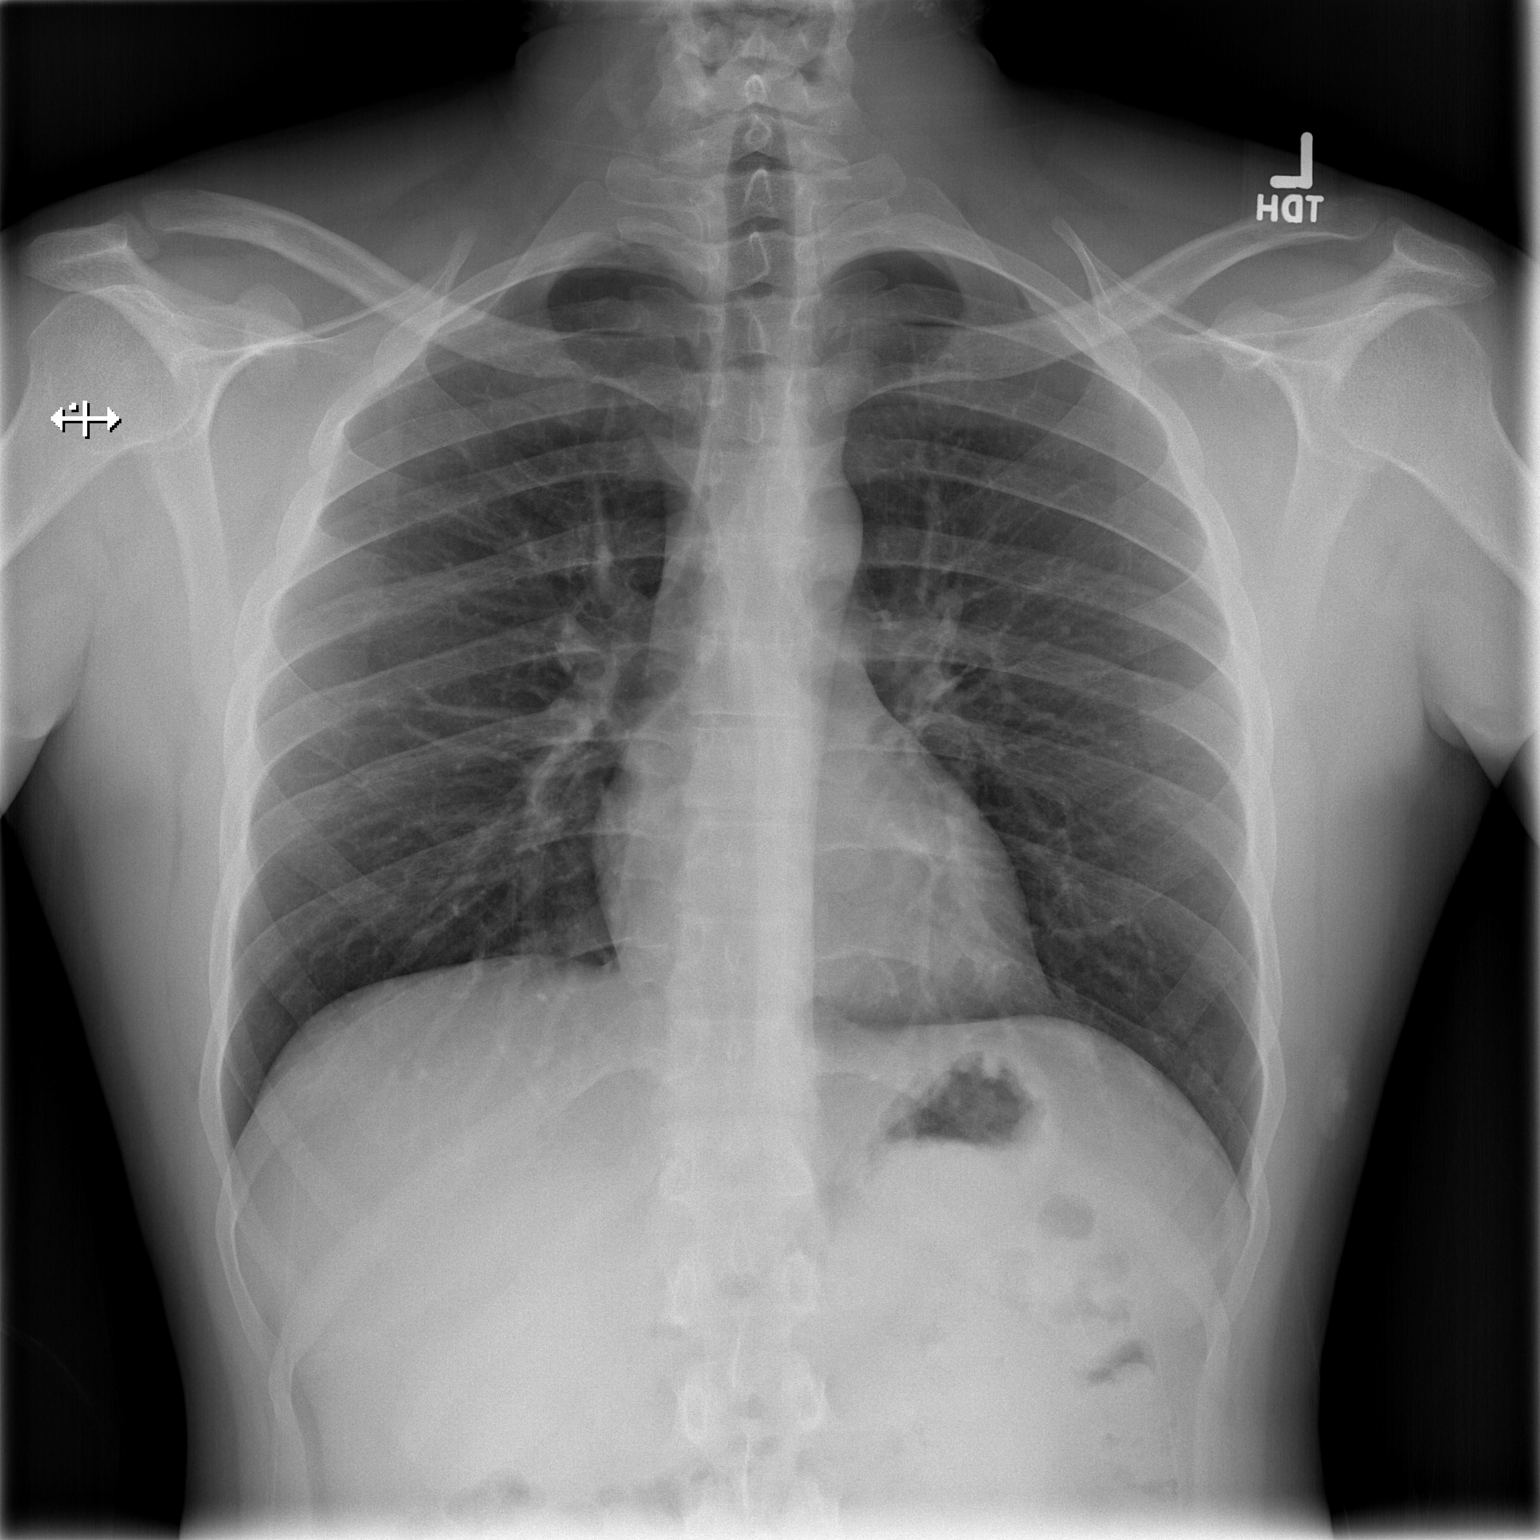

[w chest lat]
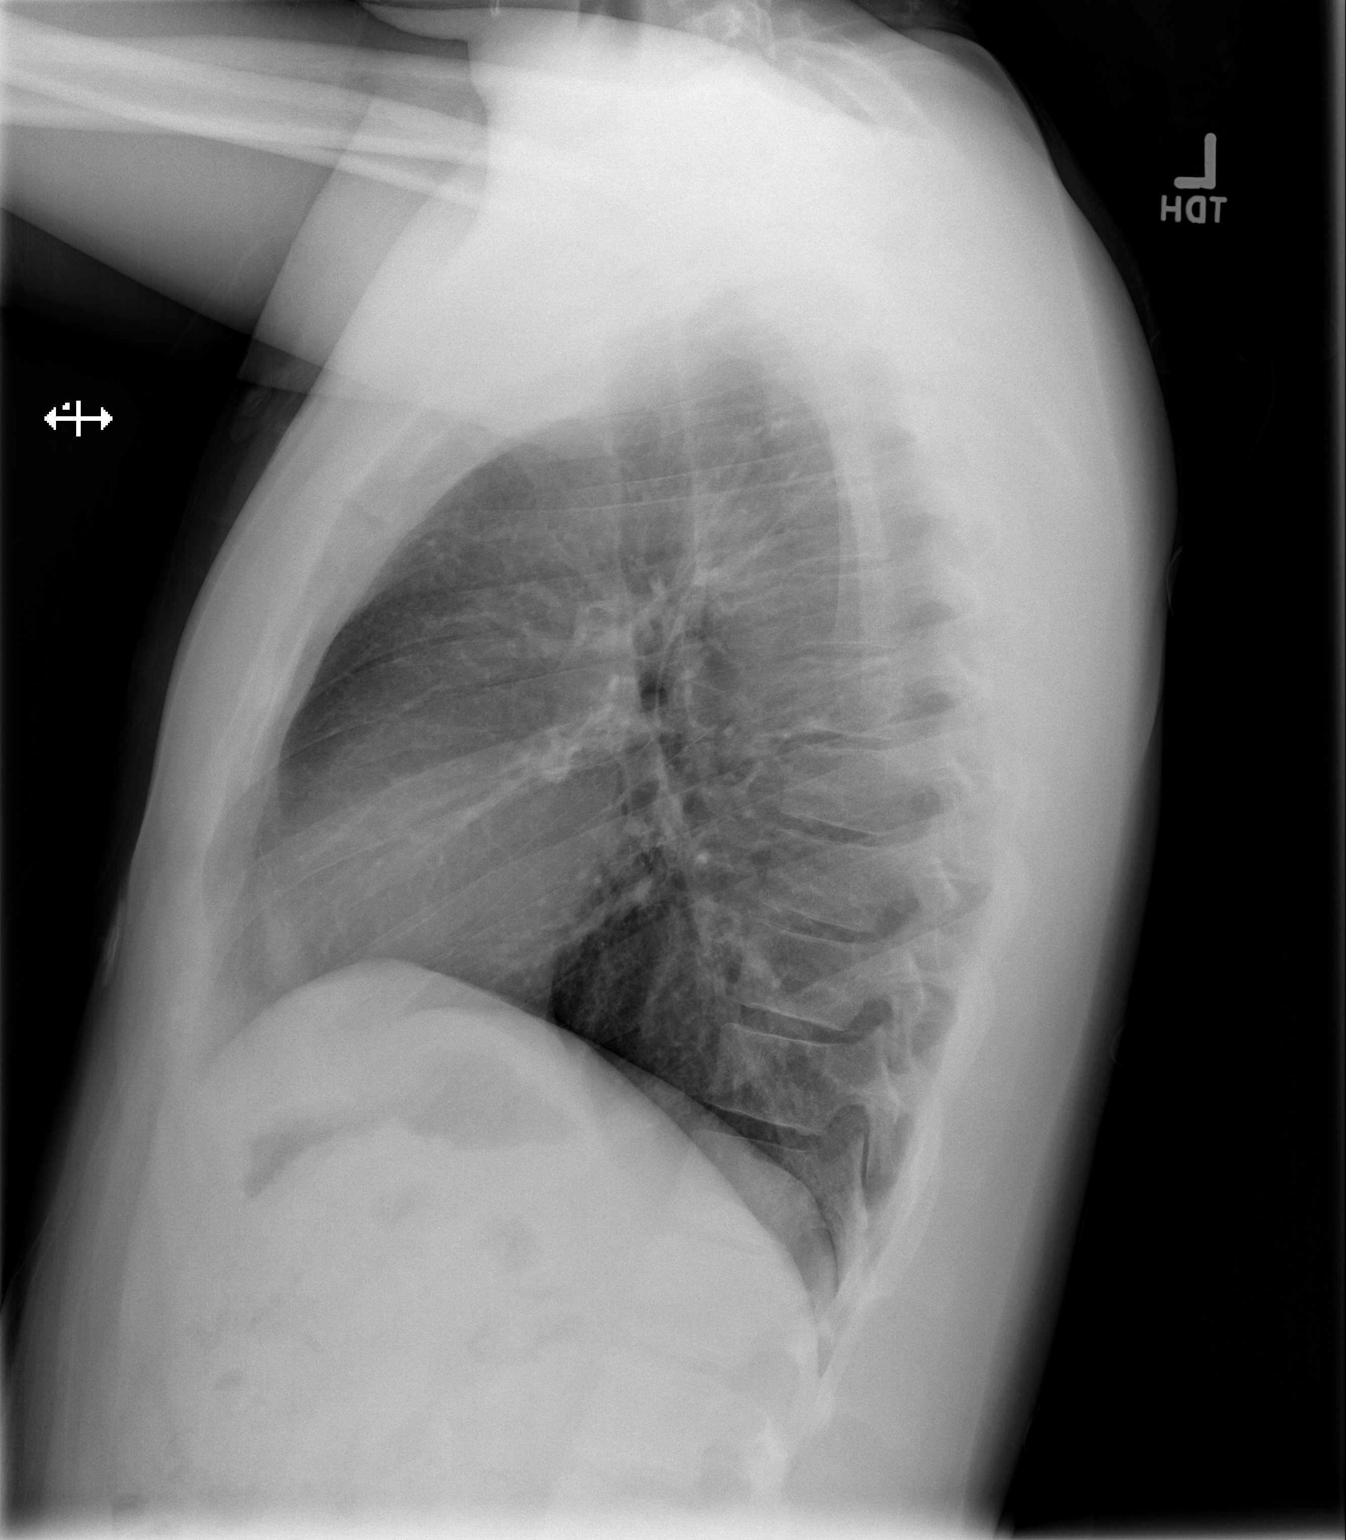

[2 of 2 positions shown; findings below may reference images not displayed]

FINDINGS: Lungs are clear.  No pleural effusion or pneumothorax.

Cardiomediastinal silhouette is within normal limits.

Visualized osseous structures are within normal limits.
IMPRESSION: Normal chest radiographs.

## 2016-03-07 ENCOUNTER — Encounter (HOSPITAL_COMMUNITY): Payer: Self-pay | Admitting: *Deleted

## 2016-03-07 ENCOUNTER — Ambulatory Visit (HOSPITAL_COMMUNITY)
Admission: EM | Admit: 2016-03-07 | Discharge: 2016-03-07 | Disposition: A | Discharge status: Home / Self Care | Payer: BLUE CROSS/BLUE SHIELD | Attending: Internal Medicine | Admitting: Internal Medicine

## 2016-03-07 DIAGNOSIS — K2970 Gastritis, unspecified, without bleeding: Secondary | ICD-10-CM

## 2016-03-07 MED ORDER — FAMOTIDINE 20 MG PO TABS
ORAL_TABLET | ORAL | Status: AC
  Filled 2016-03-07: qty 1

## 2016-03-07 MED ORDER — OMEPRAZOLE 40 MG PO CPDR: 40.0000 mg | DELAYED_RELEASE_CAPSULE | Freq: Every day | ORAL | Status: DC

## 2016-03-07 MED ORDER — ONDANSETRON 4 MG PO TBDP
ORAL_TABLET | ORAL | Status: AC
  Filled 2016-03-07: qty 1

## 2016-03-07 MED ORDER — ONDANSETRON HCL 4 MG PO TABS: 8.0000 mg | ORAL_TABLET | ORAL | Status: DC | PRN

## 2016-03-07 MED ORDER — ONDANSETRON 4 MG PO TBDP
4.0000 mg | ORAL_TABLET | Freq: Once | ORAL | Status: AC
  Administered 2016-03-07: 4 mg via ORAL

## 2016-03-07 MED ORDER — FAMOTIDINE 40 MG PO TABS: 40.0000 mg | ORAL_TABLET | Freq: Two times a day (BID) | ORAL | Status: DC

## 2016-03-07 MED ORDER — FAMOTIDINE 20 MG PO TABS
40.0000 mg | ORAL_TABLET | Freq: Once | ORAL | Status: AC
  Administered 2016-03-07: 40 mg via ORAL

## 2016-03-07 NOTE — Discharge Instructions (Signed)
Prescription for ondansetron (for nausea) and famotidine and omeprazole (for stomach acid) were sent to the pharmacy.  Alcohol (beer) and Goody's powders are very irritating to the stomach lining and will aggravate your symptoms.    Gastritis, Adult Gastritis is soreness and puffiness (inflammation) of the lining of the stomach. If you do not get help, gastritis can cause bleeding and sores (ulcers) in the stomach. HOME CARE   Only take medicine as told by your doctor.  If you were given antibiotic medicines, take them as told. Finish the medicines even if you start to feel better.  Drink enough fluids to keep your pee (urine) clear or pale yellow.  Avoid foods and drinks that make your problems worse. Foods you may want to avoid include:  Caffeine or alcohol.  Chocolate.  Mint.  Garlic and onions.  Spicy foods.  Citrus fruits, including oranges, lemons, or limes.  Food containing tomatoes, including sauce, chili, salsa, and pizza.  Fried and fatty foods.  Eat small meals throughout the day instead of large meals. GET HELP RIGHT AWAY IF:   You have black or dark red poop (stools).  You throw up (vomit) blood. It may look like coffee grounds.  You cannot keep fluids down.  Your belly (abdominal) pain gets worse.  You have a fever.  You do not feel better after 1 week.  You have any other questions or concerns. MAKE SURE YOU:   Understand these instructions.  Will watch your condition.  Will get help right away if you are not doing well or get worse.   This information is not intended to replace advice given to you by your health care provider. Make sure you discuss any questions you have with your health care provider.   Document Released: 02/05/2008 Document Revised: 11/11/2011 Document Reviewed: 10/02/2011 Elsevier Interactive Patient Education [employer]2016 [employer] Inc.

## 2016-03-07 NOTE — ED Notes (Signed)
Patient reports mid upper abdominal pain x 3 days, does not radiate, denies nausea and vomiting. Reports mild diarrhea. Has history of acid reflux. Pain is intermittent in nature and reports increases with eating.

## 2016-03-07 NOTE — ED Provider Notes (Signed)
CSN: [id number]     Arrival date & time 03/07/16  1858 History   First MD Initiated Contact with Patient 03/07/16 2003     Chief Complaint  Patient presents with  . Abdominal Pain   HPI  35 year old gentleman presents today with 3 day history of burning epigastric discomfort, fairly constant, worse after eating. Nausea. Chronic loose stools, 2-3 times daily, no real change in color or consistency. Emesis 1 today with streak hematemesis. Does not feel dizzy or lightheaded. Acknowledges drinking a 40 ounce beer every morning with a [employer] powder, to help him sleep after he gets off work. Has had similar symptoms in the past. No fever, no cough/respiratory symptoms. Feels okay otherwise  Past Medical History  Diagnosis Date  . GERD (gastroesophageal reflux disease)    History reviewed. No pertinent past surgical history. History reviewed. No pertinent family history. Social History  Substance Use Topics  . Smoking status: Current Every Day Smoker  . Smokeless tobacco: None  . Alcohol Use: Yes    Review of Systems  All other systems reviewed and are negative.   Allergies  Review of patient's allergies indicates no known allergies.  Home Medications  Takes Nexium as needed; last dose yesterday or the day before  Meds Ordered and Administered this Visit   Medications  famotidine (PEPCID) tablet 40 mg (40 mg Oral Given 03/07/16 2037)  ondansetron (ZOFRAN-ODT) disintegrating tablet 4 mg (4 mg Oral Given 03/07/16 2037)    BP 123/81 mmHg  Pulse 91  Temp(Src) 98.8 F (37.1 C) (Oral)  Resp 12  SpO2 100% Orthostatic VS for the past 24 hrs:  BP- Lying Pulse- Lying BP- Sitting Pulse- Sitting BP- Standing at 0 minutes Pulse- Standing at 0 minutes  03/07/16 2041 123/85 mmHg 82 129/90 mmHg 97 126/88 mmHg 104    Physical Exam  Constitutional: He is oriented to person, place, and time. No distress.  Alert, nicely groomed  HENT:  Head: Atraumatic.  Eyes:  Conjugate gaze, no eye  redness/drainage  Neck: Neck supple.  Cardiovascular: Normal rate and regular rhythm.   Pulmonary/Chest: No respiratory distress. He has no wheezes. He has no rales.  Lungs clear, symmetric breath sounds  Abdominal: Soft. He exhibits no distension. There is no guarding.  Moderate tenderness to deep palpation in the epigastrium  Musculoskeletal: Normal range of motion.  Neurological: He is alert and oriented to person, place, and time.  Skin: Skin is warm and dry.  No cyanosis  Nursing note and vitals reviewed.   ED Course  Procedures (including critical care time)  Improvement in [facility]; no further emesis  MDM   1. Gastritis    Meds ordered this encounter  Medications  . famotidine (PEPCID) 40 MG tablet    Sig: Take 1 tablet (40 mg total) by mouth 2 (two) times daily.    Dispense:  28 tablet    Refill:  0  . ondansetron (ZOFRAN) 4 MG tablet    Sig: Take 2 tablets (8 mg total) by mouth every 4 (four) hours as needed for nausea or vomiting.    Dispense:  20 tablet    Refill:  0  . omeprazole (PRILOSEC) 40 MG capsule    Sig: Take 1 capsule (40 mg total) by mouth daily.    Dispense:  14 capsule    Refill:  0   Decrease alcohol usage.  Recheck as needed.      Eustace MooreLaura W Malini Flemings, MD 03/13/16 [id number]

## 2016-09-16 DIAGNOSIS — R112 Nausea with vomiting, unspecified: Secondary | ICD-10-CM | POA: Diagnosis not present

## 2016-09-16 DIAGNOSIS — J00 Acute nasopharyngitis [common cold]: Secondary | ICD-10-CM | POA: Diagnosis not present

## 2017-03-18 ENCOUNTER — Encounter (HOSPITAL_COMMUNITY): Payer: Self-pay | Admitting: Emergency Medicine

## 2017-03-18 ENCOUNTER — Ambulatory Visit (HOSPITAL_COMMUNITY)
Admission: EM | Admit: 2017-03-18 | Discharge: 2017-03-18 | Disposition: A | Discharge status: Home / Self Care | Payer: BLUE CROSS/BLUE SHIELD | Attending: Internal Medicine | Admitting: Internal Medicine

## 2017-03-18 DIAGNOSIS — R0982 Postnasal drip: Secondary | ICD-10-CM | POA: Diagnosis not present

## 2017-03-18 DIAGNOSIS — J069 Acute upper respiratory infection, unspecified: Secondary | ICD-10-CM | POA: Diagnosis not present

## 2017-03-18 DIAGNOSIS — J3489 Other specified disorders of nose and nasal sinuses: Secondary | ICD-10-CM

## 2017-03-18 MED ORDER — IPRATROPIUM BROMIDE 0.06 % NA SOLN: 2.0000 | Freq: Four times a day (QID) | NASAL | 12 refills | Status: DC

## 2017-03-18 NOTE — Discharge Instructions (Signed)
Use the Atrovent nasal spray for runny nose. The following medications and advise may also help with your symptoms. Be sure to drink plenty fluids stay well-hydrated. Recommend stop smoking as this can prolong her symptoms. Sudafed PE 10 mg every 4 to 6 hours as needed for congestion Allegra or Zyrtec daily as needed for drainage and runny nose. For stronger antihistamine may take Chlor-Trimeton 2 to 4 mg every 4 to 6 hours, may cause drowsiness. Saline nasal spray used frequently. Drink plenty of fluids and stay well-hydrated. Flonase or Rhinocort nasal spray daily

## 2017-03-18 NOTE — ED Notes (Signed)
Spoke to Tesoro Corporationdavid mabe, np about work note.  Written as instructed by Hayden Rasmussendavid mabe, np

## 2017-03-18 NOTE — ED Provider Notes (Signed)
CSN: [id number]     Arrival date & time 03/18/17  1651 History   First MD Initiated Contact with Patient 03/18/17 1826     Chief Complaint  Patient presents with  . Sore Throat  . Cough   (Consider location/radiation/quality/duration/timing/severity/associated sxs/prior Treatment) 36 year old male complaining of minor sore throat, runny nose, cough and at time of stomach upset last evening and this morning where he vomited once each time. Total of 2 episodes of vomiting. He says he occasionally has mild transient abdominal discomfort but not now. He also has a history of GERD. 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 This tends to exacerbate some of his symptoms. Denies fever or chills. He does smoke daily.      Past Medical History:  Diagnosis Date  . GERD (gastroesophageal reflux disease)    History reviewed. No pertinent surgical history. History reviewed. No pertinent family history. Social History  Substance Use Topics  . Smoking status: Current Every Day Smoker  . Smokeless tobacco: Not on file  . Alcohol use Yes    Review of Systems  Constitutional: Positive for activity change. Negative for diaphoresis, fatigue and fever.  HENT: Positive for congestion, postnasal drip, rhinorrhea and sore throat. Negative for ear pain and facial swelling.   Eyes: Negative for pain, discharge and redness.  Respiratory: Positive for cough. Negative for chest tightness and shortness of breath.   Cardiovascular: Negative.   Gastrointestinal: Negative.   Musculoskeletal: Negative.  Negative for neck pain and neck stiffness.  Neurological: Negative.     Allergies  Patient has no known allergies.  Home Medications   Prior to Admission medications   Medication Sig Start Date End Date Taking? Authorizing Provider  ipratropium (ATROVENT) 0.06 % nasal spray Place 2 sprays into both nostrils 4 (four) times daily.  03/18/17   Hayden Rasmussen, NP   Meds Ordered and Administered this Visit  Medications - No data to display  BP 111/70 (BP Location: Right Arm)   Pulse 70   Temp 98.7 F (37.1 C) (Oral)   Resp 16   SpO2 100%  No data found.   Physical Exam  Constitutional: He is oriented to person, place, and time. He appears well-developed and well-nourished. No distress.  HENT:  Mouth/Throat: No oropharyngeal exudate.  Bilateral TMs are normal. Oropharynx with clear PND, minor cobblestoning and minor erythema.   Eyes: EOM are normal.  Neck: Normal range of motion. Neck supple.  Cardiovascular: Normal rate, regular rhythm, normal heart sounds and intact distal pulses.   Pulmonary/Chest: Effort normal and breath sounds normal. No respiratory distress. He has no wheezes.  Abdominal:  Minor epigastric tenderness. No rebound or guarding.  Musculoskeletal: Normal range of motion. He exhibits no edema.  Lymphadenopathy:    He has no cervical adenopathy.  Neurological: He is alert and oriented to person, place, and time.  Skin: Skin is warm and dry. No rash noted.  Psychiatric: He has a normal mood and affect.  Nursing note and vitals reviewed.   Urgent Care Course     Procedures (including critical care time)  Labs Review Labs Reviewed - No data to display  Imaging Review No results found.   Visual Acuity Review  Right Eye Distance:   Left Eye Distance:   Bilateral Distance:    Right Eye Near:   Left Eye Near:    Bilateral Near:  MDM   1. Viral upper respiratory tract infection   2. PND (post-nasal drip)   3. Rhinorrhea    Use the Atrovent nasal spray for runny nose. The following medications and advise may also help with your symptoms. Be sure to drink plenty fluids stay well-hydrated. Recommend stop smoking as this can prolong her symptoms. Sudafed PE 10 mg every 4 to 6 hours as needed for congestion Allegra or Zyrtec daily as needed for drainage and runny  nose. For stronger antihistamine may take Chlor-Trimeton 2 to 4 mg every 4 to 6 hours, may cause drowsiness. Saline nasal spray used frequently. Drink plenty of fluids and stay well-hydrated. Flonase or Rhinocort nasal spray daily Meds ordered this encounter  Medications  . ipratropium (ATROVENT) 0.06 % nasal spray    Sig: Place 2 sprays into both nostrils 4 (four) times daily.    Dispense:  15 mL    Refill:  12    Order Specific Question:   Supervising Provider    Answer:   Eustace MooreMURRAY, LAURA W [[id number]]       Hayden RasmussenMabe, Nidal Rivet, NP 03/18/17 Paulo [id number]

## 2017-03-18 NOTE — ED Triage Notes (Signed)
[facility] with a complaint of a sore throat and a cough x 2 days.

## 2017-04-27 ENCOUNTER — Encounter: Payer: Self-pay | Admitting: Family Medicine

## 2017-04-28 ENCOUNTER — Ambulatory Visit (INDEPENDENT_AMBULATORY_CARE_PROVIDER_SITE_OTHER): Payer: BLUE CROSS/BLUE SHIELD | Admitting: Family Medicine

## 2017-04-28 ENCOUNTER — Encounter: Payer: Self-pay | Admitting: Family Medicine

## 2017-04-28 VITALS — BP 110/70 | HR 76 | Temp 98.2°F | Ht 66.0 in | Wt 138.2 lb

## 2017-04-28 DIAGNOSIS — Z7689 Persons encountering health services in other specified circumstances: Secondary | ICD-10-CM | POA: Diagnosis not present

## 2017-04-28 DIAGNOSIS — F5101 Primary insomnia: Secondary | ICD-10-CM | POA: Diagnosis not present

## 2017-04-28 DIAGNOSIS — K219 Gastro-esophageal reflux disease without esophagitis: Secondary | ICD-10-CM | POA: Diagnosis not present

## 2017-04-28 MED ORDER — ESOMEPRAZOLE MAGNESIUM 20 MG PO PACK: 20.0000 mg | PACK | Freq: Every day | ORAL | 2 refills | Status: DC

## 2017-04-28 MED ORDER — MELATONIN 5 MG PO TABS: 5.0000 mg | ORAL_TABLET | Freq: Every day | ORAL | 0 refills | Status: DC

## 2017-04-28 NOTE — Patient Instructions (Addendum)
It was great seeing you today! We have addressed the following issues today  1. I prescribe melatonin for your sleep problems. Start with 5 mg and see if it helps we can increase it to 10 mg as needed. 2. Make an appointment with your  Dr.Koval at the front desk for smoking cessation. 3. Make an appointment to see him in 2-3 months 4. I will follow up with your blood work results later this week.  If we did any lab work today, and the results require attention, either me or my nurse will get in touch with you. If everything is normal, you will get a letter in mail and a message via . If you don't hear from [place] in two weeks, please give [place] a call. Otherwise, we look forward to seeing you again at your next visit. If you have any questions or concerns before then, please call the clinic at [phone].  Please bring all your medications to every doctors visit  Sign up for My Chart to have easy access to your labs results, and communication with your Primary care physician. Please ask Front Desk for some assistance.   Please check-out at the front desk before leaving the clinic.    Take Care,   Dr. Sydnee Cabal  Insomnia Insomnia is a sleep disorder that makes it difficult to fall asleep or to stay asleep. Insomnia can cause tiredness (fatigue), low energy, difficulty concentrating, mood swings, and poor performance at work or school. There are three different ways to classify insomnia:  Difficulty falling asleep.  Difficulty staying asleep.  Waking up too early in the morning.  Any type of insomnia can be long-term (chronic) or short-term (acute). Both are common. Short-term insomnia usually lasts for three months or less. Chronic insomnia occurs at least three times a week for longer than three months. What are the causes? Insomnia may be caused by another condition, situation, or substance, such as:  Anxiety.  Certain medicines.  Gastroesophageal reflux disease (GERD) or other  gastrointestinal conditions.  Asthma or other breathing conditions.  Restless legs syndrome, sleep apnea, or other sleep disorders.  Chronic pain.  Menopause. This may include hot flashes.  Stroke.  Abuse of alcohol, tobacco, or illegal drugs.  Depression.  Caffeine.  Neurological disorders, such as Alzheimer disease.  An overactive thyroid (hyperthyroidism).  The cause of insomnia may not be known. What increases the risk? Risk factors for insomnia include:  Gender. Women are more commonly affected than men.  Age. Insomnia is more common as you get older.  Stress. This may involve your professional or personal life.  Income. Insomnia is more common in people with lower income.  Lack of exercise.  Irregular work schedule or night shifts.  Traveling between different time zones.  What are the signs or symptoms? If you have insomnia, trouble falling asleep or trouble staying asleep is the main symptom. This may lead to other symptoms, such as:  Feeling fatigued.  Feeling nervous about going to sleep.  Not feeling rested in the morning.  Having trouble concentrating.  Feeling irritable, anxious, or depressed.  How is this treated? Treatment for insomnia depends on the cause. If your insomnia is caused by an underlying condition, treatment will focus on addressing the condition. Treatment may also include:  Medicines to help you sleep.  Counseling or therapy.  Lifestyle adjustments.  Follow these instructions at home:  Take medicines only as directed by your health care provider.  Keep regular sleeping and waking  hours. Avoid naps.  Keep a sleep diary to help you and your health care provider figure out what could be causing your insomnia. Include: ? When you sleep. ? When you wake up during the night. ? How well you sleep. ? How rested you feel the next day. ? Any side effects of medicines you are taking. ? What you eat and drink.  Make your  bedroom a comfortable place where it is easy to fall asleep: ? Put up shades or special blackout curtains to block light from outside. ? Use a white noise machine to block noise. ? Keep the temperature cool.  Exercise regularly as directed by your health care provider. Avoid exercising right before bedtime.  Use relaxation techniques to manage stress. Ask your health care provider to suggest some techniques that may work well for you. These may include: ? Breathing exercises. ? Routines to release muscle tension. ? Visualizing peaceful scenes.  Cut back on alcohol, caffeinated beverages, and cigarettes, especially close to bedtime. These can disrupt your sleep.  Do not overeat or eat spicy foods right before bedtime. This can lead to digestive discomfort that can make it hard for you to sleep.  Limit screen use before bedtime. This includes: ? Watching TV. ? Using your smartphone, tablet, and computer.  Stick to a routine. This can help you fall asleep faster. Try to do a quiet activity, brush your teeth, and go to bed at the same time each night.  Get out of bed if you are still awake after 15 minutes of trying to sleep. Keep the lights down, but try reading or doing a quiet activity. When you feel sleepy, go back to bed.  Make sure that you drive carefully. Avoid driving if you feel very sleepy.  Keep all follow-up appointments as directed by your health care provider. This is important. Contact a health care provider if:  You are tired throughout the day or have trouble in your daily routine due to sleepiness.  You continue to have sleep problems or your sleep problems get worse. Get help right away if:  You have serious thoughts about hurting yourself or someone else. This information is not intended to replace advice given to you by your health care provider. Make sure you discuss any questions you have with your health care provider. Document Released: 08/16/2000 Document  Revised: 01/19/2016 Document Reviewed: 05/20/2014 Elsevier Interactive Patient Education  2018 [employer].  Steps to Quit Smoking Smoking tobacco can be harmful to your health and can affect almost every organ in your body. Smoking puts you, and those around you, at risk for developing many serious chronic diseases. Quitting smoking is difficult, but it is one of the best things that you can do for your health. It is never too late to quit. What are the benefits of quitting smoking? When you quit smoking, you lower your risk of developing serious diseases and conditions, such as:  Lung cancer or lung disease, such as COPD.  Heart disease.  Stroke.  Heart attack.  Infertility.  Osteoporosis and bone fractures.  Additionally, symptoms such as coughing, wheezing, and shortness of breath may get better when you quit. You may also find that you get sick less often because your body is stronger at fighting off colds and infections. If you are pregnant, quitting smoking can help to reduce your chances of having a baby of low birth weight. How do I get ready to quit? When you decide to quit smoking, create  a plan to make sure that you are successful. Before you quit:  Pick a date to quit. Set a date within the next two weeks to give you time to prepare.  Write down the reasons why you are quitting. Keep this list in places where you will see it often, such as on your bathroom mirror or in your car or wallet.  Identify the people, places, things, and activities that make you want to smoke (triggers) and avoid them. Make sure to take these actions: ? Throw away all cigarettes at home, at work, and in your car. ? Throw away smoking accessories, such as Set designer. ? Clean your car and make sure to empty the ashtray. ? Clean your home, including curtains and carpets.  Tell your family, friends, and coworkers that you are quitting. Support from your loved ones can make quitting  easier.  Talk with your health care provider about your options for quitting smoking.  Find out what treatment options are covered by your health insurance.  What strategies can I use to quit smoking? Talk with your healthcare provider about different strategies to quit smoking. Some strategies include:  Quitting smoking altogether instead of gradually lessening how much you smoke over a period of time. Research shows that quitting "cold [place]" is more successful than gradually quitting.  Attending in-person counseling to help you build problem-solving skills. You are more likely to have success in quitting if you attend several counseling sessions. Even short sessions of 10 minutes can be effective.  Finding resources and support systems that can help you to quit smoking and remain smoke-free after you quit. These resources are most helpful when you use them often. They can include: ? Online chats with a Veterinary surgeon. ? Telephone quitlines. ? Automotive engineer. ? Support groups or group counseling. ? Text messaging programs. ? Mobile phone applications.  Taking medicines to help you quit smoking. (If you are pregnant or breastfeeding, talk with your health care provider first.) Some medicines contain nicotine and some do not. Both types of medicines help with cravings, but the medicines that include nicotine help to relieve withdrawal symptoms. Your health care provider may recommend: ? Nicotine patches, gum, or lozenges. ? Nicotine inhalers or sprays. ? Non-nicotine medicine that is taken by mouth.  Talk with your health care provider about combining strategies, such as taking medicines while you are also receiving in-person counseling. Using these two strategies together makes you more likely to succeed in quitting than if you used either strategy on its own. If you are pregnant or breastfeeding, talk with your health care provider about finding counseling or other support  strategies to quit smoking. Do not take medicine to help you quit smoking unless told to do so by your health care provider. What things can I do to make it easier to quit? Quitting smoking might feel overwhelming at first, but there is a lot that you can do to make it easier. Take these important actions:  Reach out to your family and friends and ask that they support and encourage you during this time. Call telephone quitlines, reach out to support groups, or work with a counselor for support.  Ask people who smoke to avoid smoking around you.  Avoid places that trigger you to smoke, such as bars, parties, or smoke-break areas at work.  Spend time around people who do not smoke.  Lessen stress in your life, because stress can be a smoking trigger for some people. To  lessen stress, try: ? Exercising regularly. ? Deep-breathing exercises. ? Yoga. ? Meditating. ? Performing a body scan. This involves closing your eyes, scanning your body from head to toe, and noticing which parts of your body are particularly tense. Purposefully relax the muscles in those areas.  Download or purchase mobile phone or tablet apps (applications) that can help you stick to your quit plan by providing reminders, tips, and encouragement. There are many free apps, such as QuitGuide from the [employer] developer for Disease Control and Prevention). You can find other support for quitting smoking (smoking cessation) through smokefree.gov and other websites.  How will I feel when I quit smoking? Within the first 24 hours of quitting smoking, you may start to feel some withdrawal symptoms. These symptoms are usually most noticeable 2-3 days after quitting, but they usually do not last beyond 2-3 weeks. Changes or symptoms that you might experience include:  Mood swings.  Restlessness, anxiety, or irritation.  Difficulty concentrating.  Dizziness.  Strong cravings for sugary foods in addition to nicotine.  Mild weight  gain.  Constipation.  Nausea.  Coughing or a sore throat.  Changes in how your medicines work in your body.  A depressed mood.  Difficulty sleeping (insomnia).  After the first 2-3 weeks of quitting, you may start to notice more positive results, such as:  Improved sense of smell and taste.  Decreased coughing and sore throat.  Slower heart rate.  Lower blood pressure.  Clearer skin.  The ability to breathe more easily.  Fewer sick days.  Quitting smoking is very challenging for most people. Do not get discouraged if you are not successful the first time. Some people need to make many attempts to quit before they achieve long-term success. Do your best to stick to your quit plan, and talk with your health care provider if you have any questions or concerns. This information is not intended to replace advice given to you by your health care provider. Make sure you discuss any questions you have with your health care provider. Document Released: 08/13/2001 Document Revised: 04/16/2016 Document Reviewed: 01/03/2015 Elsevier Interactive Patient Education  2017 [employer].

## 2017-04-28 NOTE — Progress Notes (Signed)
   Subjective:    Patient ID: Juan Esparza, male    DOB: 1981-04-08, 36 y.o.   MRN: [id number]   CC: Establishing care with PCP/Practice  HPI: Patient is a 36 yo male with a past medical history significant for GERD and tobacco abuse who presents today to establish care with PCP.  Difficulty with sleep: Patient reports some difficulty falling asleep this has been a chronic problem for him. Patient has tried nyquil in the past with minimal change and reports he has been drinking more to help him fall asleep.  GERD: Well controlled on Nexium. Takes one pill a day. Patient reports occasional vomiting due to reflux and intermittent abdominal pain.   Tobacco Use: Patient reports that he currently smoke 1 pack every 2 days. Patient is motivated and would like to quit.  Smoking status reviewed   Review of Systems  Constitutional: Negative.   HENT: Negative.   Eyes: Negative.   Respiratory: Negative.   Cardiovascular: Negative.   Gastrointestinal: Positive for heartburn.  Genitourinary: Negative.   Musculoskeletal: Negative.   Skin: Negative.   Neurological: Negative.   Endo/Heme/Allergies: Negative.   Psychiatric/Behavioral: The patient has insomnia.     Past Medical History:  Diagnosis Date  . GERD (gastroesophageal reflux disease)   . Sleep apnea     No past surgical history on file.  Past medical history, surgical, family, and social history reviewed and updated in the EMR as appropriate.  Objective:  BP 110/70 (BP Location: Left Arm, Patient Position: Sitting, Cuff Size: Normal)   Pulse 76   Temp 98.2 F (36.8 C) (Oral)   Ht 5\' 6"  (1.676 m)   Wt 138 lb 3.2 oz (62.7 kg)   SpO2 98%   BMI 22.31 kg/m   Vitals and nursing note reviewed  General: NAD, pleasant, able to participate in exam Cardiac: RRR, normal heart sounds, no murmurs. 2+ radial and PT pulses bilaterally Respiratory: CTAB, normal effort, No wheezes, rales or rhonchi Abdomen: soft, nontender,  nondistended, no hepatic or splenomegaly, +BS Extremities: no edema or cyanosis. WWP. Skin: warm and dry, no rashes noted Neuro: alert and oriented x4, no focal deficits Psych: Normal affect and mood   Assessment & Plan:    #Establishing care with PCP Patient has no acute concerns at today visit and is essentially here to establish care. Patient last blood work was done in 2014. Will order labs to have a baseline and follow up on results. --Order, CBC, CMP, Lipid panel and TSH  #Insomnia, chronic  Discussed with patient possible etiology for current difficulties sleeping. Patient does not not appears depressed though PHQ 2 or 9 not given. Discuss sleep hygiene and bed time routine. --Will start patient on melatonin 5 mg will increase to 10 mg if no improvement in the next two weeks --Sleep Hygiene handout given  #Smoking Cessation Patient is motivated to quit smoking. Currently 10 cigarettes a day. Patient will work on cutting down. Offered patient available resources in clinic and patient was interested. --Schedule Smoking cessation session with Dr.Koval  #GERD Refilled  Nexium. Continue with current home regimen.  --Esomeprazole 20 mg daily as needed   Lovena Neighbours, MD [facility] PGY-2

## 2017-04-29 ENCOUNTER — Telehealth: Payer: Self-pay | Admitting: Family Medicine

## 2017-04-29 LAB — LIPID PANEL
CHOL/HDL RATIO: 3.4 ratio (ref 0.0–5.0)
Cholesterol, Total: 230 mg/dL — ABNORMAL HIGH (ref 100–199)
HDL: 68 mg/dL (ref >39)
LDL CALC: 138 mg/dL — AB (ref 0–99)
TRIGLYCERIDES: 122 mg/dL (ref 0–149)
VLDL Cholesterol Cal: 24 mg/dL (ref 5–40)

## 2017-04-29 LAB — CBC WITH DIFFERENTIAL/PLATELET
BASOS: 1 %
Basophils Absolute: 0 10*3/uL (ref 0.0–0.2)
EOS (ABSOLUTE): 0.2 10*3/uL (ref 0.0–0.4)
EOS: 2 %
HEMATOCRIT: 44.9 % (ref 37.5–51.0)
HEMOGLOBIN: 15 g/dL (ref 13.0–17.7)
Immature Grans (Abs): 0 10*3/uL (ref 0.0–0.1)
Immature Granulocytes: 0 %
Lymphocytes Absolute: 2.3 10*3/uL (ref 0.7–3.1)
Lymphs: 31 %
MCH: 27.2 pg (ref 26.6–33.0)
MCHC: 33.4 g/dL (ref 31.5–35.7)
MCV: 81 fL (ref 79–97)
MONOCYTES: 5 %
MONOS ABS: 0.4 10*3/uL (ref 0.1–0.9)
NEUTROS ABS: 4.5 10*3/uL (ref 1.4–7.0)
Neutrophils: 61 %
Platelets: 273 10*3/uL (ref 150–379)
RBC: 5.52 x10E6/uL (ref 4.14–5.80)
RDW: 14.1 % (ref 12.3–15.4)
WBC: 7.4 10*3/uL (ref 3.4–10.8)

## 2017-04-29 LAB — CMP14+EGFR
A/G RATIO: 1.8 (ref 1.2–2.2)
ALK PHOS: 45 IU/L (ref 39–117)
ALT: 21 IU/L (ref 0–44)
AST: 25 IU/L (ref 0–40)
Albumin: 5 g/dL (ref 3.5–5.5)
BUN / CREAT RATIO: 17 (ref 9–20)
BUN: 20 mg/dL (ref 6–20)
Bilirubin Total: 0.6 mg/dL (ref 0.0–1.2)
CO2: 24 mmol/L (ref 20–29)
CREATININE: 1.15 mg/dL (ref 0.76–1.27)
Calcium: 10.3 mg/dL — ABNORMAL HIGH (ref 8.7–10.2)
Chloride: 100 mmol/L (ref 96–106)
GFR calc Af Amer: 95 mL/min/{1.73_m2} (ref >59)
GFR, EST NON AFRICAN AMERICAN: 82 mL/min/{1.73_m2} (ref >59)
GLOBULIN, TOTAL: 2.8 g/dL (ref 1.5–4.5)
Glucose: 94 mg/dL (ref 65–99)
POTASSIUM: 4.7 mmol/L (ref 3.5–5.2)
SODIUM: 139 mmol/L (ref 134–144)
Total Protein: 7.8 g/dL (ref 6.0–8.5)

## 2017-04-29 LAB — TSH: TSH: 0.989 u[IU]/mL (ref 0.450–4.500)

## 2017-04-29 MED ORDER — ESOMEPRAZOLE MAGNESIUM 20 MG PO CPDR: 20.0000 mg | DELAYED_RELEASE_CAPSULE | Freq: Every day | ORAL | 2 refills | Status: DC

## 2017-04-29 NOTE — Telephone Encounter (Signed)
Pt would to talk to dr Sydnee Cabal about a Rx at the pharmacy.  pts insurance will not cover Nexium. Can dr give him something else?

## 2017-04-29 NOTE — Addendum Note (Signed)
Addended by: Lovena Neighbours F on: 04/29/2017 01:55 PM   Modules accepted: Orders

## 2017-05-01 NOTE — Telephone Encounter (Signed)
Patient called again wanting to speak with PCP regarding the nexium. Patient's insurance will not cover the medication. Patient advised that nurse will try to complete a prior authorization to see if insurance will cover. PA completed online at [email or web address]. PA is pending per [employer]. Review could take 24-72 hours to complete.  Clovis PuMartin, Ruairi Stutsman L, RN

## 2017-05-08 ENCOUNTER — Telehealth: Payer: Self-pay | Admitting: Family Medicine

## 2017-05-08 NOTE — Telephone Encounter (Signed)
Would like results form last weeks blood work. Please call him at [phone]

## 2017-05-13 ENCOUNTER — Other Ambulatory Visit: Payer: Self-pay | Admitting: Family Medicine

## 2017-05-13 DIAGNOSIS — K219 Gastro-esophageal reflux disease without esophagitis: Secondary | ICD-10-CM

## 2017-05-13 MED ORDER — OMEPRAZOLE 40 MG PO CPDR: 80.0000 mg | DELAYED_RELEASE_CAPSULE | Freq: Every day | ORAL | 0 refills | Status: DC

## 2017-05-13 NOTE — Telephone Encounter (Signed)
Lab results reviewed with patient. Discussed physical activity 150 minutes per week and decreasing amount of fried, fatty foods. Pt made aware that Rx for omeprazole has been sent to [facility]. Patient very appreciative of information. Kinnie FeilL. Lamonta Cypress, RN, BSN

## 2017-05-13 NOTE — Telephone Encounter (Signed)
LM for patient to call back.  Please inform him that we had to order generic prilosec for him to try in place of the nexium and he will have to fail this medication and 1 more medication before they will cover nexium.  Will continue to try and reach patient this week. Shariece Viveiros,CMA

## 2017-07-01 ENCOUNTER — Ambulatory Visit: Payer: BLUE CROSS/BLUE SHIELD | Admitting: Family Medicine

## 2017-07-15 ENCOUNTER — Ambulatory Visit: Payer: BLUE CROSS/BLUE SHIELD | Admitting: Family Medicine

## 2017-09-18 ENCOUNTER — Ambulatory Visit: Payer: BLUE CROSS/BLUE SHIELD | Admitting: Student

## 2017-10-09 ENCOUNTER — Ambulatory Visit (HOSPITAL_COMMUNITY)
Admission: EM | Admit: 2017-10-09 | Discharge: 2017-10-09 | Disposition: A | Discharge status: Home / Self Care | Payer: BLUE CROSS/BLUE SHIELD | Attending: Family Medicine | Admitting: Family Medicine

## 2017-10-09 ENCOUNTER — Encounter (HOSPITAL_COMMUNITY): Payer: Self-pay | Admitting: Family Medicine

## 2017-10-09 DIAGNOSIS — K0889 Other specified disorders of teeth and supporting structures: Secondary | ICD-10-CM

## 2017-10-09 MED ORDER — AMOXICILLIN 875 MG PO TABS: 875.0000 mg | ORAL_TABLET | Freq: Two times a day (BID) | ORAL | 0 refills | Status: AC

## 2017-10-09 MED ORDER — HYDROCODONE-ACETAMINOPHEN 5-325 MG PO TABS: 1.0000 | ORAL_TABLET | Freq: Four times a day (QID) | ORAL | 0 refills | Status: DC | PRN

## 2017-10-09 NOTE — ED Triage Notes (Signed)
Pt here for left upper dental pain since this am. Took tylenol for p[ain earlier without relief.

## 2017-10-13 NOTE — ED Provider Notes (Signed)
  [facility]   [id number] 10/09/17 Arrival Time: 1802  ASSESSMENT & PLAN:  1. Pain, dental     Meds ordered this encounter  Medications  . HYDROcodone-acetaminophen (NORCO/VICODIN) 5-325 MG tablet    Sig: Take 1 tablet by mouth every 6 (six) hours as needed for moderate pain or severe pain.    Dispense:  8 tablet    Refill:  0  . amoxicillin (AMOXIL) 875 MG tablet    Sig: Take 1 tablet (875 mg total) by mouth 2 (two) times daily for 10 days.    Dispense:  20 tablet    Refill:  0   [place] Controlled Substances Registry consulted for this patient. I feel the risk/benefit ratio today is favorable for proceeding with this prescription for a controlled substance. Medication sedation precautions given.  He plans to schedule dental evaluation as soon as possible. May f/u here as needed.  Reviewed expectations re: course of current medical issues. Questions answered. Outlined signs and symptoms indicating need for more acute intervention. Patient verbalized understanding. After Visit Summary given.   SUBJECTIVE:  Juan StaggersLarry D Crunk is a 37 y.o. male who reports abrupt onset of left upper dental pain. Present for 1 day. Afebrile. Tolerating PO intake but reports pain with chewing. Normal swallowing. He does not see a dentist regularly. No neck swelling or pain. OTC analgesics without relief.  ROS: As per HPI.  OBJECTIVE:  Vitals:   10/09/17 1848  BP: 130/79  Pulse: 69  Resp: 18  Temp: 98.3 F (36.8 C)  SpO2: 97%    General appearance: alert; no distress HENT: normocephalic; atraumatic; dentition: poor; gingival hypertrophy over left upper gums near broken tooth; no areas of fluctuance Neck: supple without LAD Lungs: normal respirations Skin: warm and dry Psychological: alert and cooperative; normal mood and affect  No Known Allergies  Past Medical History:  Diagnosis Date  . GERD (gastroesophageal reflux disease)   . Sleep apnea    Social History    Socioeconomic History  . Marital status: Single    Spouse name: Not on file  . Number of children: Not on file  . Years of education: Not on file  . Highest education level: Not on file  Social Needs  . Financial resource strain: Not on file  . Food insecurity - worry: Not on file  . Food insecurity - inability: Not on file  . Transportation needs - medical: Not on file  . Transportation needs - non-medical: Not on file  Occupational History    Employer: [employer]  Tobacco Use  . Smoking status: Current Every Day Smoker    Years: 2.00    Types: Cigarettes  . Smokeless tobacco: Never Used  Substance and Sexual Activity  . Alcohol use: Yes  . Drug use: No  . Sexual activity: Yes    Partners: Female  Other Topics Concern  . Not on file  Social History Narrative  . Not on file   Family History  Problem Relation Age of Onset  . Asthma Mother   . Depression Mother   . Stroke Mother   . Alcohol abuse Father   . Drug abuse Father   . Asthma Sister    History reviewed. No pertinent surgical history.   Mardella LaymanHagler, Mareena Cavan, MD 10/13/17 [phone]

## 2017-11-12 ENCOUNTER — Encounter: Payer: BLUE CROSS/BLUE SHIELD | Admitting: Family Medicine

## 2017-11-20 ENCOUNTER — Ambulatory Visit (INDEPENDENT_AMBULATORY_CARE_PROVIDER_SITE_OTHER): Payer: BLUE CROSS/BLUE SHIELD | Admitting: Family Medicine

## 2017-11-20 ENCOUNTER — Other Ambulatory Visit: Payer: Self-pay

## 2017-11-20 ENCOUNTER — Encounter: Payer: Self-pay | Admitting: Family Medicine

## 2017-11-20 DIAGNOSIS — K219 Gastro-esophageal reflux disease without esophagitis: Secondary | ICD-10-CM

## 2017-11-20 LAB — POCT H PYLORI SCREEN: H Pylori Screen, POC: NEGATIVE

## 2017-11-20 MED ORDER — OMEPRAZOLE 40 MG PO CPDR: 80.0000 mg | DELAYED_RELEASE_CAPSULE | Freq: Every day | ORAL | 1 refills | Status: DC

## 2017-11-20 NOTE — Patient Instructions (Signed)
It was great seeing you today! We have addressed the following issues today  1. We will do a test to check for H.Pylori which is bacteria that can cause heart burn/reflux. Based on the results, I will refer you to GI.  If we did any lab work today, and the results require attention, either me or my nurse will get in touch with you. If everything is normal, you will get a letter in mail and a message via . If you don't hear from us in two weeks, please give us a call. Otherwise, we look forward to seeing you again at your next visit. If you have any questions or concerns before then, please call the clinic at [phone].  Please bring all your medications to every doctors visit  Sign up for My Chart to have easy access to your labs results, and communication with your Primary care physician. Please ask Front Desk for some assistance.   Please check-out at the front desk before leaving the clinic.    Take Care,   Dr. Sydnee Cabaliallo   Health Maintenance, Male A healthy lifestyle and preventive care is important for your health and wellness. Ask your health care provider about what schedule of regular examinations is right for you. What should I know about weight and diet? Eat a Healthy Diet  Eat plenty of vegetables, fruits, whole grains, low-fat dairy products, and lean protein.  Do not eat a lot of foods high in solid fats, added sugars, or salt.  Maintain a Healthy Weight Regular exercise can help you achieve or maintain a healthy weight. You should:  Do at least 150 minutes of exercise each week. The exercise should increase your heart rate and make you sweat (moderate-intensity exercise).  Do strength-training exercises at least twice a week.  Watch Your Levels of Cholesterol and Blood Lipids  Have your blood tested for lipids and cholesterol every 5 years starting at 37 years of age. If you are at high risk for heart disease, you should start having your blood tested when you are  37 years old. You may need to have your cholesterol levels checked more often if: ? Your lipid or cholesterol levels are high. ? You are older than 37 years of age. ? You are at high risk for heart disease.  What should I know about cancer screening? Many types of cancers can be detected early and may often be prevented. Lung Cancer  You should be screened every year for lung cancer if: ? You are a current smoker who has smoked for at least 30 years. ? You are a former smoker who has quit within the past 15 years.  Talk to your health care provider about your screening options, when you should start screening, and how often you should be screened.  Colorectal Cancer  Routine colorectal cancer screening usually begins at 37 years of age and should be repeated every 5-10 years until you are 37 years old. You may need to be screened more often if early forms of precancerous polyps or small growths are found. Your health care provider may recommend screening at an earlier age if you have risk factors for colon cancer.  Your health care provider may recommend using home test kits to check for hidden blood in the stool.  A small camera at the end of a tube can be used to examine your colon (sigmoidoscopy or colonoscopy). This checks for the earliest forms of colorectal cancer.  Prostate and Testicular Cancer  Depending on your age and overall health, your health care provider may do certain tests to screen for prostate and testicular cancer.  Talk to your health care provider about any symptoms or concerns you have about testicular or prostate cancer.  Skin Cancer  Check your skin from head to toe regularly.  Tell your health care provider about any new moles or changes in moles, especially if: ? There is a change in a mole's size, shape, or color. ? You have a mole that is larger than a pencil eraser.  Always use sunscreen. Apply sunscreen liberally and repeat throughout the  day.  Protect yourself by wearing long sleeves, pants, a wide-brimmed hat, and sunglasses when outside.  What should I know about heart disease, diabetes, and high blood pressure?  If you are 38-60 years of age, have your blood pressure checked every 3-5 years. If you are 40 years of age or older, have your blood pressure checked every year. You should have your blood pressure measured twice-once when you are at a hospital or clinic, and once when you are not at a hospital or clinic. Record the average of the two measurements. To check your blood pressure when you are not at a hospital or clinic, you can use: ? An automated blood pressure machine at a pharmacy. ? A home blood pressure monitor.  Talk to your health care provider about your target blood pressure.  If you are between 45-44 years old, ask your health care provider if you should take aspirin to prevent heart disease.  Have regular diabetes screenings by checking your fasting blood sugar level. ? If you are at a normal weight and have a low risk for diabetes, have this test once every three years after the age of 22. ? If you are overweight and have a high risk for diabetes, consider being tested at a younger age or more often.  A one-time screening for abdominal aortic aneurysm (AAA) by ultrasound is recommended for men aged 65-75 years who are current or former smokers. What should I know about preventing infection? Hepatitis B If you have a higher risk for hepatitis B, you should be screened for this virus. Talk with your health care provider to find out if you are at risk for hepatitis B infection. Hepatitis C Blood testing is recommended for:  Everyone born from [birth year] through 1965.  Anyone with known risk factors for hepatitis C.  Sexually Transmitted Diseases (STDs)  You should be screened each year for STDs including gonorrhea and chlamydia if: ? You are sexually active and are younger than 37 years of age. ? You are  older than 37 years of age and your health care provider tells you that you are at risk for this type of infection. ? Your sexual activity has changed since you were last screened and you are at an increased risk for chlamydia or gonorrhea. Ask your health care provider if you are at risk.  Talk with your health care provider about whether you are at high risk of being infected with HIV. Your health care provider may recommend a prescription medicine to help prevent HIV infection.  What else can I do?  Schedule regular health, dental, and eye exams.  Stay current with your vaccines (immunizations).  Do not use any tobacco products, such as cigarettes, chewing tobacco, and e-cigarettes. If you need help quitting, ask your health care provider.  Limit alcohol intake to no more than 2 drinks per day. One drink  equals 12 ounces of beer, 5 ounces of wine, or 1 ounces of hard liquor.  Do not use street drugs.  Do not share needles.  Ask your health care provider for help if you need support or information about quitting drugs.  Tell your health care provider if you often feel depressed.  Tell your health care provider if you have ever been abused or do not feel safe at home. This information is not intended to replace advice given to you by your health care provider. Make sure you discuss any questions you have with your health care provider. Document Released: 02/15/2008 Document Revised: 04/17/2016 Document Reviewed: 05/23/2015 Elsevier Interactive Patient Education  [employer].

## 2017-11-20 NOTE — Progress Notes (Signed)
   Subjective:    Patient ID: Juan Esparza, male    DOB: 09/28/1980, 37 y.o.   MRN: [id number]   CC: General adult health maintenance  HPI: Patient is a 37 yo male with a past medical history significant for GERD who presents today for annual health maintenance visit. Patient has no acute complaints today. Patient continue to take omeprazole for GERD as needed. He has been working out on a daily basis. Patient reports that he drinks 1-2 beers a night after work depending of his mood. Patient smoke half a pack of cigarettes a day. He is currently sexually active with one partner. Patient denies any chest pain, SOB, abdominal pain, HA, n/v, vision or hearing changes.  Smoking status reviewed   ROS: all other systems were reviewed and are negative other than in the HPI   Past Medical History:  Diagnosis Date  . GERD (gastroesophageal reflux disease)   . Sleep apnea     History reviewed. No pertinent surgical history.  Past medical history, surgical, family, and social history reviewed and updated in the EMR as appropriate.  Objective:  BP 100/60   Pulse 82   Temp 98.2 F (36.8 C) (Oral)   Wt 135 lb (61.2 kg)   SpO2 99%   BMI 21.79 kg/m   Vitals and nursing note reviewed  General: NAD, pleasant, able to participate in exam Cardiac: RRR, normal heart sounds, no murmurs. 2+ radial and PT pulses bilaterally Respiratory: CTAB, normal effort, No wheezes, rales or rhonchi Abdomen: soft, nontender, nondistended, no hepatic or splenomegaly, +BS Extremities: no edema or cyanosis. WWP. Skin: warm and dry, no rashes noted Neuro: alert and oriented x4, no focal deficits Psych: Normal affect and mood   Assessment & Plan:   #General Heatlh maintenance Patient still complaining of intermittent GERD currently on omeprazole and reports good control. Patient is requested a GI referral for further evaluation given severity of symptoms and patient's age. Will test for H. Pylori if negative  will refer to GI. In the meantime patient will continue current regimen. Discuss need for smoking cessation and better control on alcohol consumption given role they can play on his health and his reflux symptoms.Encourage patient to continue with current exercise routine. --POCT h pylori  --Refer to GI if negative for further evaluation as requested by patient     Lovena NeighboursAbdoulaye Kari Kerth, MD Juan Esparza

## 2017-11-26 ENCOUNTER — Encounter: Payer: Self-pay | Admitting: Family Medicine

## 2017-12-29 ENCOUNTER — Other Ambulatory Visit: Payer: Self-pay

## 2017-12-29 ENCOUNTER — Emergency Department (HOSPITAL_COMMUNITY)
Admission: EM | Admit: 2017-12-29 | Discharge: 2017-12-29 | Disposition: A | Discharge status: Home / Self Care | Payer: BLUE CROSS/BLUE SHIELD | Attending: Emergency Medicine | Admitting: Emergency Medicine

## 2017-12-29 ENCOUNTER — Encounter (HOSPITAL_COMMUNITY): Payer: Self-pay | Admitting: Emergency Medicine

## 2017-12-29 ENCOUNTER — Encounter: Payer: Self-pay | Admitting: Family Medicine

## 2017-12-29 ENCOUNTER — Emergency Department (HOSPITAL_COMMUNITY): Payer: BLUE CROSS/BLUE SHIELD

## 2017-12-29 DIAGNOSIS — R112 Nausea with vomiting, unspecified: Secondary | ICD-10-CM | POA: Insufficient documentation

## 2017-12-29 DIAGNOSIS — R1084 Generalized abdominal pain: Secondary | ICD-10-CM | POA: Diagnosis not present

## 2017-12-29 DIAGNOSIS — R197 Diarrhea, unspecified: Secondary | ICD-10-CM | POA: Insufficient documentation

## 2017-12-29 LAB — LIPASE, BLOOD: LIPASE: 47 U/L (ref 11–51)

## 2017-12-29 LAB — COMPREHENSIVE METABOLIC PANEL
ALBUMIN: 3.8 g/dL (ref 3.5–5.0)
ALT: 29 U/L (ref 17–63)
AST: 34 U/L (ref 15–41)
Alkaline Phosphatase: 47 U/L (ref 38–126)
Anion gap: 11 (ref 5–15)
BUN: 11 mg/dL (ref 6–20)
CHLORIDE: 103 mmol/L (ref 101–111)
CO2: 24 mmol/L (ref 22–32)
Calcium: 9 mg/dL (ref 8.9–10.3)
Creatinine, Ser: 1.15 mg/dL (ref 0.61–1.24)
GFR calc Af Amer: >60 mL/min (ref >60)
GLUCOSE: 115 mg/dL — AB (ref 65–99)
Potassium: 3.9 mmol/L (ref 3.5–5.1)
SODIUM: 138 mmol/L (ref 135–145)
Total Bilirubin: 0.9 mg/dL (ref 0.3–1.2)
Total Protein: 6.7 g/dL (ref 6.5–8.1)

## 2017-12-29 LAB — CBC WITH DIFFERENTIAL/PLATELET
BASOS ABS: 0 10*3/uL (ref 0.0–0.1)
Basophils Relative: 1 %
EOS PCT: 2 %
Eosinophils Absolute: 0.1 10*3/uL (ref 0.0–0.7)
HEMATOCRIT: 42.6 % (ref 39.0–52.0)
Hemoglobin: 13.7 g/dL (ref 13.0–17.0)
LYMPHS ABS: 1.4 10*3/uL (ref 0.7–4.0)
LYMPHS PCT: 22 %
MCH: 27 pg (ref 26.0–34.0)
MCHC: 32.2 g/dL (ref 30.0–36.0)
MCV: 83.9 fL (ref 78.0–100.0)
MONO ABS: 0.4 10*3/uL (ref 0.1–1.0)
MONOS PCT: 6 %
NEUTROS ABS: 4.6 10*3/uL (ref 1.7–7.7)
Neutrophils Relative %: 71 %
PLATELETS: 231 10*3/uL (ref 150–400)
RBC: 5.08 MIL/uL (ref 4.22–5.81)
RDW: 13 % (ref 11.5–15.5)
WBC: 6.5 10*3/uL (ref 4.0–10.5)

## 2017-12-29 LAB — I-STAT TROPONIN, ED: Troponin i, poc: 0 ng/mL (ref 0.00–0.08)

## 2017-12-29 MED ORDER — ONDANSETRON 4 MG PO TBDP
4.0000 mg | ORAL_TABLET | Freq: Once | ORAL | Status: AC
  Administered 2017-12-29: 4 mg via ORAL
  Filled 2017-12-29: qty 1

## 2017-12-29 NOTE — ED Triage Notes (Signed)
Patient came up to nurse first's station to say he had to leave because he's supposed to take a family member to the hospital for scheduled surgery at 6am. RN attempted to encourage patient to stay, but he said that he would look at [facility] tomorrow and if things worsened, he'd come back afterward. ID band cut off, patient left ambulatory with steady gait. NAD.

## 2017-12-29 NOTE — ED Notes (Signed)
Called pt's name to bring back to triage, no answer. Called twice, triage RN notified.\

## 2017-12-29 NOTE — ED Triage Notes (Signed)
Pt reports emesis x 10 in last 48 hours, diarrhea "all day" yesterday. Pt states symptoms began after eating at [employer] Saturday. Upper abd pain 8/10.

## 2017-12-29 NOTE — ED Provider Notes (Signed)
Patient placed in Quick Look pathway, seen and evaluated   Chief Complaint: N/V/D  HPI:   Patient reports that for the past 2 days he has been nauseous, multiple episodes of diarrhea and vomiting.  He reports generalized abdominal pain that is migratory and cramp-like in nature.  No history of abdominal surgeries.  ROS: No fevers  Physical Exam:   Gen: No distress  Neuro: Awake and Alert  Skin: Warm    Focused Exam: Abdomen is diffusely TTP.    Initiation of care has begun. The patient has been counseled on the process, plan, and necessity for staying for the completion/evaluation, and the remainder of the medical screening examination   Juan Esparza 12/29/17 Raynelle Bring, MD 12/30/17 [phone]

## 2017-12-30 ENCOUNTER — Encounter: Payer: Self-pay | Admitting: Family Medicine

## 2017-12-30 DIAGNOSIS — J020 Streptococcal pharyngitis: Secondary | ICD-10-CM | POA: Diagnosis not present

## 2017-12-30 DIAGNOSIS — R509 Fever, unspecified: Secondary | ICD-10-CM | POA: Diagnosis not present

## 2017-12-30 DIAGNOSIS — J029 Acute pharyngitis, unspecified: Secondary | ICD-10-CM | POA: Diagnosis not present

## 2017-12-31 ENCOUNTER — Encounter: Payer: Self-pay | Admitting: Family Medicine

## 2017-12-31 ENCOUNTER — Encounter: Payer: Self-pay | Admitting: *Deleted

## 2018-01-04 ENCOUNTER — Other Ambulatory Visit: Payer: Self-pay

## 2018-01-04 ENCOUNTER — Telehealth (HOSPITAL_COMMUNITY): Payer: Self-pay | Admitting: Emergency Medicine

## 2018-01-04 ENCOUNTER — Encounter (HOSPITAL_COMMUNITY): Payer: Self-pay | Admitting: Emergency Medicine

## 2018-01-04 ENCOUNTER — Ambulatory Visit (HOSPITAL_COMMUNITY)
Admission: EM | Admit: 2018-01-04 | Discharge: 2018-01-04 | Disposition: A | Discharge status: Home / Self Care | Payer: BLUE CROSS/BLUE SHIELD

## 2018-01-04 DIAGNOSIS — R059 Cough, unspecified: Secondary | ICD-10-CM

## 2018-01-04 DIAGNOSIS — R05 Cough: Secondary | ICD-10-CM | POA: Diagnosis not present

## 2018-01-04 MED ORDER — FLUTICASONE PROPIONATE 50 MCG/ACT NA SUSP: 1.0000 | Freq: Every day | NASAL | 0 refills | Status: DC

## 2018-01-04 MED ORDER — CETIRIZINE HCL 10 MG PO CAPS: 10.0000 mg | ORAL_CAPSULE | Freq: Every day | ORAL | 0 refills | Status: DC

## 2018-01-04 MED ORDER — BENZONATATE 200 MG PO CAPS: 200.0000 mg | ORAL_CAPSULE | Freq: Three times a day (TID) | ORAL | 0 refills | Status: DC

## 2018-01-04 NOTE — ED Provider Notes (Signed)
[facility]    CSN: [id number] Arrival date & time: 01/04/18  1814     History   Chief Complaint Chief Complaint  Patient presents with  . URI    HPI ULICE FOLLETT is a 37 y.o. male history of sleep apnea and GERD presenting today for evaluation of a cough.  Patient states that he has had a cough since Thursday, approximately 4 days.  Symptoms have been worsening since then.  Also associated with rhinorrhea and congestion.  Has been taking Mucinex with minimal relief.  Patient tested positive for strep on Monday and since has been on penicillin, currently still taking.  Sore throat is improved as well as his fever and vomiting.  Denies shortness of breath or chest pain.  Denies history of asthma, patient is a half a pack/day smoker, was approximately a 2-3-pack-year history.  HPI  Past Medical History:  Diagnosis Date  . GERD (gastroesophageal reflux disease)   . Sleep apnea     There are no active problems to display for this patient.   History reviewed. No pertinent surgical history.     Home Medications    Prior to Admission medications   Medication Sig Start Date End Date Taking? Authorizing Provider  AMOXICILLIN PO Take by mouth.   Yes [provider]  benzonatate (TESSALON) 200 MG capsule Take 1 capsule (200 mg total) by mouth every 8 (eight) hours. 01/04/18   Wieters, Hallie C, PA-C  Cetirizine HCl 10 MG CAPS Take 1 capsule (10 mg total) by mouth daily for 15 days. 01/04/18 01/19/18  Wieters, Hallie C, PA-C  esomeprazole (NEXIUM) 20 MG capsule Take 1 capsule (20 mg total) by mouth daily at 12 noon. 04/29/17   Diallo, Lilia Argue, MD  fluticasone (FLONASE) 50 MCG/ACT nasal spray Place 1-2 sprays into both nostrils daily for 7 days. 01/04/18 01/11/18  Wieters, Hallie C, PA-C  HYDROcodone-acetaminophen (NORCO/VICODIN) 5-325 MG tablet Take 1 tablet by mouth every 6 (six) hours as needed for moderate pain or severe pain. 10/09/17   Mardella Layman, MD  ipratropium  (ATROVENT) 0.06 % nasal spray Place 2 sprays into both nostrils 4 (four) times daily. 03/18/17   Hayden Rasmussen, NP  Melatonin 5 MG TABS Take 1 tablet (5 mg total) by mouth at bedtime. 04/28/17   Diallo, Lilia Argue, MD  omeprazole (PRILOSEC) 40 MG capsule Take 2 capsules (80 mg total) by mouth daily. 11/20/17   Lovena Neighbours, MD    Family History Family History  Problem Relation Age of Onset  . Asthma Mother   . Depression Mother   . Stroke Mother   . Alcohol abuse Father   . Drug abuse Father   . Asthma Sister     Social History Social History   Tobacco Use  . Smoking status: Current Every Day Smoker    Years: 2.00    Types: Cigarettes  . Smokeless tobacco: Never Used  Substance Use Topics  . Alcohol use: Yes  . Drug use: No     Allergies   Patient has no known allergies.   Review of Systems Review of Systems  Constitutional: Negative for activity change, appetite change, fatigue and fever.  HENT: Positive for congestion and rhinorrhea. Negative for ear pain, postnasal drip, sinus pressure and sore throat.   Eyes: Negative for pain and itching.  Respiratory: Positive for cough. Negative for shortness of breath.   Cardiovascular: Negative for chest pain.  Gastrointestinal: Negative for abdominal pain, diarrhea, nausea and vomiting.  Musculoskeletal: Negative  for myalgias.  Skin: Negative for rash.  Neurological: Negative for dizziness, light-headedness and headaches.     Physical Exam Triage Vital Signs ED Triage Vitals  Enc Vitals Group     BP 01/04/18 1832 (!) 138/91     Pulse Rate 01/04/18 1832 95     Resp 01/04/18 1832 18     Temp 01/04/18 1832 98.4 F (36.9 C)     Temp Source 01/04/18 1832 Oral     SpO2 01/04/18 1832 99 %     Weight --      Height --      Head Circumference --      Peak Flow --      Pain Score 01/04/18 1827 8     Pain Loc --      Pain Edu? --      Excl. in [place]? --    No data found.  Updated Vital Signs BP (!) 138/91 (BP  Location: Left Arm)   Pulse 95   Temp 98.4 F (36.9 C) (Oral)   Resp 18   SpO2 99%   Visual Acuity Right Eye Distance:   Left Eye Distance:   Bilateral Distance:    Right Eye Near:   Left Eye Near:    Bilateral Near:     Physical Exam  Constitutional: He appears well-developed and well-nourished.  HENT:  Head: Normocephalic and atraumatic.  Bilateral TMs nonerythematous, nasal mucosa erythematous with clear rhinorrhea present, posterior oropharynx erythematous, no tonsillar enlargement or exudate.  Eyes: Conjunctivae are normal.  Neck: Neck supple.  Cardiovascular: Normal rate and regular rhythm.  No murmur heard. Pulmonary/Chest: Effort normal and breath sounds normal. No respiratory distress.  Breathing comfortably at rest, CTABL  Frequent coughing in room  Abdominal: Soft. There is no tenderness.  Musculoskeletal: He exhibits no edema.  Neurological: He is alert.  Skin: Skin is warm and dry.  Psychiatric: He has a normal mood and affect.  Nursing note and vitals reviewed.    UC Treatments / Results  Labs (all labs ordered are listed, but only abnormal results are displayed) Labs Reviewed - No data to display  EKG None  Radiology No results found.  Procedures Procedures (including critical care time)  Medications Ordered in UC Medications - No data to display  Initial Impression / Assessment and Plan / UC Course  I have reviewed the triage vital signs and the nursing notes.  Pertinent labs & imaging results that were available during my care of the patient were reviewed by me and considered in my medical decision making (see chart for details).     Patient with a cough, vital signs stable, exam unremarkable.  Likely viral.  Low suspicion of pneumonia or bronchitis at this time.  Will recommend symptomatic management of Zyrtec and Flonase for congestion, Tessalon for cough. Discussed strict return precautions. Patient verbalized understanding and is  agreeable with plan.  Final Clinical Impressions(s) / UC Diagnoses   Final diagnoses:  Cough     Discharge Instructions     Begin flonase and zyrtec for congestion- flonase begin with 2 sprays per nostril daily, then decrease to o/nostril daily as symptoms improving  For cough use tessalon as needed  Continue penicillin   ED Prescriptions    Medication Sig Dispense Auth. Provider   benzonatate (TESSALON) 200 MG capsule Take 1 capsule (200 mg total) by mouth every 8 (eight) hours. 28 capsule Wieters, Hallie C, PA-C   Cetirizine HCl 10 MG CAPS Take 1 capsule (10  mg total) by mouth daily for 15 days. 15 capsule Wieters, Hallie C, PA-C   fluticasone (FLONASE) 50 MCG/ACT nasal spray Place 1-2 sprays into both nostrils daily for 7 days. 1 g Wieters, [place] C, PA-C     Controlled Substance Prescriptions  Controlled Substance Registry consulted? Not Applicable   Lew Dawes, [place] 01/04/18 1610

## 2018-01-04 NOTE — Telephone Encounter (Signed)
Sent medications ordered today to [facility] on [address]

## 2018-01-04 NOTE — ED Triage Notes (Signed)
Cough started Thursday.  Cough is worsening.  No sore throat, runny nose.

## 2018-01-04 NOTE — Discharge Instructions (Signed)
Begin flonase and zyrtec for congestion- flonase begin with 2 sprays per nostril daily, then decrease to o/nostril daily as symptoms improving  For cough use tessalon as needed  Continue penicillin

## 2018-02-08 ENCOUNTER — Encounter: Payer: Self-pay | Admitting: Family Medicine

## 2018-03-06 ENCOUNTER — Encounter: Payer: Self-pay | Admitting: Family Medicine

## 2018-03-06 ENCOUNTER — Other Ambulatory Visit: Payer: Self-pay | Admitting: Family Medicine

## 2018-03-06 DIAGNOSIS — K219 Gastro-esophageal reflux disease without esophagitis: Secondary | ICD-10-CM

## 2018-03-09 ENCOUNTER — Encounter: Payer: Self-pay | Admitting: Family Medicine

## 2018-03-21 ENCOUNTER — Encounter

## 2018-06-09 ENCOUNTER — Encounter: Payer: Self-pay | Admitting: Family Medicine

## 2018-06-11 ENCOUNTER — Ambulatory Visit: Payer: BLUE CROSS/BLUE SHIELD | Admitting: Family Medicine

## 2018-09-01 ENCOUNTER — Encounter: Payer: Self-pay | Admitting: Family Medicine

## 2018-09-03 ENCOUNTER — Encounter: Payer: Self-pay | Admitting: Family Medicine

## 2018-09-06 ENCOUNTER — Other Ambulatory Visit: Payer: Self-pay | Admitting: Family Medicine

## 2018-09-06 DIAGNOSIS — K219 Gastro-esophageal reflux disease without esophagitis: Secondary | ICD-10-CM

## 2018-12-07 ENCOUNTER — Encounter: Payer: Self-pay | Admitting: Family Medicine

## 2018-12-07 DIAGNOSIS — K219 Gastro-esophageal reflux disease without esophagitis: Secondary | ICD-10-CM

## 2018-12-07 MED ORDER — OMEPRAZOLE 40 MG PO CPDR: 80.0000 mg | DELAYED_RELEASE_CAPSULE | Freq: Every day | ORAL | 0 refills | Status: DC

## 2019-05-04 ENCOUNTER — Encounter: Payer: Self-pay | Admitting: Family Medicine

## 2019-05-04 ENCOUNTER — Other Ambulatory Visit: Payer: Self-pay

## 2019-05-05 MED ORDER — ESOMEPRAZOLE MAGNESIUM 20 MG PO CPDR: 20.0000 mg | DELAYED_RELEASE_CAPSULE | Freq: Every day | ORAL | 2 refills | Status: DC

## 2019-05-06 ENCOUNTER — Encounter: Payer: Self-pay | Admitting: Family Medicine

## 2019-05-06 DIAGNOSIS — K219 Gastro-esophageal reflux disease without esophagitis: Secondary | ICD-10-CM

## 2019-05-08 ENCOUNTER — Encounter: Payer: Self-pay | Admitting: Family Medicine

## 2019-05-11 ENCOUNTER — Other Ambulatory Visit: Payer: Self-pay

## 2019-05-11 MED ORDER — OMEPRAZOLE 40 MG PO CPDR: 80.0000 mg | DELAYED_RELEASE_CAPSULE | Freq: Every day | ORAL | 0 refills | Status: DC

## 2019-05-11 NOTE — Telephone Encounter (Signed)
Opened in error

## 2019-10-20 ENCOUNTER — Ambulatory Visit
Admission: EM | Admit: 2019-10-20 | Discharge: 2019-10-20 | Disposition: A | Discharge status: Home / Self Care | Payer: BC Managed Care – PPO | Attending: Emergency Medicine | Admitting: Emergency Medicine

## 2019-10-20 DIAGNOSIS — R059 Cough, unspecified: Secondary | ICD-10-CM

## 2019-10-20 DIAGNOSIS — M791 Myalgia, unspecified site: Secondary | ICD-10-CM | POA: Diagnosis not present

## 2019-10-20 DIAGNOSIS — Z20822 Contact with and (suspected) exposure to covid-19: Secondary | ICD-10-CM

## 2019-10-20 DIAGNOSIS — R112 Nausea with vomiting, unspecified: Secondary | ICD-10-CM

## 2019-10-20 DIAGNOSIS — R05 Cough: Secondary | ICD-10-CM | POA: Diagnosis not present

## 2019-10-20 MED ORDER — ONDANSETRON 4 MG PO TBDP
4.0000 mg | ORAL_TABLET | Freq: Once | ORAL | Status: AC
  Administered 2019-10-20: 4 mg via ORAL

## 2019-10-20 MED ORDER — ONDANSETRON HCL 4 MG PO TABS: 4.0000 mg | ORAL_TABLET | Freq: Four times a day (QID) | ORAL | 0 refills | Status: DC

## 2019-10-20 NOTE — Discharge Instructions (Addendum)
Your COVID test is pending - it is important to quarantine / isolate at home until your results are back. °If you test positive and would like further evaluation for persistent or worsening symptoms, you may schedule an E-visit or virtual (video) visit throughout the [facility] MyChart app or website. ° °PLEASE NOTE: If you develop severe chest pain or shortness of breath please go to the ER or call 9-1-1 for further evaluation --> DO NOT schedule electronic or virtual visits for this. °Please call our office for further guidance / recommendations as needed. ° °For information about the Covid vaccine, please visit [email or web address] °

## 2019-10-20 NOTE — ED Provider Notes (Signed)
[facility]    CSN: [id number] Arrival date & time: 10/20/19  1632      History   Chief Complaint Chief Complaint  Patient presents with  . Emesis    HPI Juan Esparza is a 39 y.o. male with history of GERD, sleep apnea presenting for nasal congestion, dry cough, generalized headache, myalgias, nausea since 1 PM this afternoon.  Has had 3 episodes of emesis since.  Reports mild blood streaks, though denies gross hematemesis, biliary emesis, projectile emesis.  No fever, severe abdominal pain, change in bowel or bladder habit.  Denies chest pain, shortness of breath.  Has not taken anything for symptoms.  No known Covid exposures, though employer sent him for testing today.  States he is able to keep down fluids without increased nausea or emesis.   Past Medical History:  Diagnosis Date  . GERD (gastroesophageal reflux disease)   . Sleep apnea     There are no problems to display for this patient.   History reviewed. No pertinent surgical history.     Home Medications    Prior to Admission medications   Medication Sig Start Date End Date Taking? Authorizing Provider  Cetirizine HCl 10 MG CAPS Take 1 capsule (10 mg total) by mouth daily for 15 days. 01/04/18 01/19/18  Wieters, Hallie C, PA-C  esomeprazole (NEXIUM) 20 MG capsule Take 1 capsule (20 mg total) by mouth daily at 12 noon. 05/05/19   Bonnita Hollow, MD  ipratropium (ATROVENT) 0.06 % nasal spray Place 2 sprays into both nostrils 4 (four) times daily. 03/18/17   Janne Napoleon, NP  ondansetron (ZOFRAN) 4 MG tablet Take 1 tablet (4 mg total) by mouth every 6 (six) hours. 10/20/19   Hall-Potvin, [place], PA-C    Family History Family History  Problem Relation Age of Onset  . Asthma Mother   . Depression Mother   . Stroke Mother   . Alcohol abuse Father   . Drug abuse Father   . Asthma Sister     Social History Social History   Tobacco Use  . Smoking status: Current Every Day Smoker    Years:  2.00    Types: Cigarettes  . Smokeless tobacco: Never Used  Substance Use Topics  . Alcohol use: Yes  . Drug use: No     Allergies   Patient has no known allergies.   Review of Systems As per HPI   Physical Exam Triage Vital Signs ED Triage Vitals  Enc Vitals Group     BP 10/20/19 1701 (!) 151/101     Pulse Rate 10/20/19 1701 84     Resp 10/20/19 1701 18     Temp 10/20/19 1701 98.2 F (36.8 C)     Temp Source 10/20/19 1701 Oral     SpO2 10/20/19 1701 97 %     Weight --      Height --      Head Circumference --      Peak Flow --      Pain Score 10/20/19 1702 8     Pain Loc --      Pain Edu? --      Excl. in [place]? --    No data found.  Updated Vital Signs BP (!) 138/96 (BP Location: Left Arm)   Pulse 84   Temp 98.2 F (36.8 C) (Oral)   Resp 18   SpO2 97%   Visual Acuity Right Eye Distance:   Left Eye Distance:   Bilateral  Distance:    Right Eye Near:   Left Eye Near:    Bilateral Near:     Physical Exam Constitutional:      General: He is not in acute distress.    Appearance: He is not toxic-appearing or diaphoretic.  HENT:     Head: Normocephalic and atraumatic.     Mouth/Throat:     Mouth: Mucous membranes are moist.     Pharynx: Oropharynx is clear.  Eyes:     General: No scleral icterus.    Conjunctiva/sclera: Conjunctivae normal.     Pupils: Pupils are equal, round, and reactive to light.  Neck:     Comments: Trachea midline, negative JVD Cardiovascular:     Rate and Rhythm: Normal rate and regular rhythm.  Pulmonary:     Effort: Pulmonary effort is normal. No respiratory distress.     Breath sounds: No wheezing or rales.  Abdominal:     General: Abdomen is flat. Bowel sounds are normal. There is no distension.     Tenderness: There is no abdominal tenderness. There is no right CVA tenderness, left CVA tenderness or guarding.  Musculoskeletal:     Cervical back: Neck supple. No tenderness.  Lymphadenopathy:     Cervical: No cervical  adenopathy.  Skin:    Capillary Refill: Capillary refill takes less than 2 seconds.     Coloration: Skin is not jaundiced or pale.     Findings: No rash.  Neurological:     Mental Status: He is alert and oriented to person, place, and time.      UC Treatments / Results  Labs (all labs ordered are listed, but only abnormal results are displayed) Labs Reviewed  NOVEL CORONAVIRUS, NAA    EKG   Radiology No results found.  Procedures Procedures (including critical care time)  Medications Ordered in UC Medications  ondansetron (ZOFRAN-ODT) disintegrating tablet 4 mg (4 mg Oral Given 10/20/19 1726)    Initial Impression / Assessment and Plan / UC Course  I have reviewed the triage vital signs and the nursing notes.  Pertinent labs & imaging results that were available during my care of the patient were reviewed by me and considered in my medical decision making (see chart for details).     Patient afebrile, nontoxic, with SpO2 97%.  Covid PCR pending.  Patient to quarantine until results are back.  Patient given Zofran ODT which he tolerated well with mild to moderate improvement in nausea at time of discharge.  We will continue supportive management.  Return precautions discussed, patient verbalized understanding and is agreeable to plan. Final Clinical Impressions(s) / UC Diagnoses   Final diagnoses:  Non-intractable vomiting with nausea, unspecified vomiting type  Myalgia  Cough     Discharge Instructions     Your COVID test is pending - it is important to quarantine / isolate at home until your results are back. If you test positive and would like further evaluation for persistent or worsening symptoms, you may schedule an E-visit or virtual (video) visit throughout the [facility] app or website.  PLEASE NOTE: If you develop severe chest pain or shortness of breath please go to the ER or call 9-1-1 for further evaluation --> DO NOT schedule electronic or  virtual visits for this. Please call our office for further guidance / recommendations as needed.  For information about the Covid vaccine, please visit [email or web address]    ED Prescriptions    Medication Sig Dispense Auth. Provider   ondansetron (  ZOFRAN) 4 MG tablet Take 1 tablet (4 mg total) by mouth every 6 (six) hours. 12 tablet Hall-Potvin, [place], PA-C     PDMP not reviewed this encounter.   Hall-Potvin, [place], [place] 10/20/19 1743

## 2019-10-20 NOTE — ED Triage Notes (Signed)
Pt c/o vomiting x3 today, nasal congestion, cough, headaches, and body aches since 1pm today

## 2019-10-22 LAB — NOVEL CORONAVIRUS, NAA: SARS-CoV-2, NAA: NOT DETECTED

## 2019-10-23 ENCOUNTER — Other Ambulatory Visit: Payer: Self-pay | Admitting: Family Medicine

## 2019-10-23 DIAGNOSIS — K219 Gastro-esophageal reflux disease without esophagitis: Secondary | ICD-10-CM

## 2019-11-08 ENCOUNTER — Ambulatory Visit: Payer: BC Managed Care – PPO | Admitting: Family Medicine

## 2019-11-08 ENCOUNTER — Other Ambulatory Visit: Payer: Self-pay

## 2019-11-08 ENCOUNTER — Encounter: Payer: Self-pay | Admitting: Family Medicine

## 2019-11-08 DIAGNOSIS — K219 Gastro-esophageal reflux disease without esophagitis: Secondary | ICD-10-CM | POA: Diagnosis not present

## 2019-11-08 DIAGNOSIS — R197 Diarrhea, unspecified: Secondary | ICD-10-CM

## 2019-11-08 MED ORDER — FAMOTIDINE 40 MG PO TABS: 40.0000 mg | ORAL_TABLET | Freq: Every day | ORAL | 2 refills | Status: DC

## 2019-11-08 MED ORDER — LOPERAMIDE HCL 2 MG PO TABS: 2.0000 mg | ORAL_TABLET | Freq: Three times a day (TID) | ORAL | 0 refills | Status: DC | PRN

## 2019-11-08 NOTE — Patient Instructions (Addendum)
Thank you for coming to see me today. It was a pleasure! Today we talked about:   For your diarrhea, we will try taking Imodium 3 times a day as needed for loose stools.  The medication omeprazole may be causing your diarrhea so we will trial off of this.  In order to help with your acid reflux we will start a different medication called Pepcid.  Please take this at bedtime and stop taking any Nexium or Prilosec.  Please message me in 2 weeks to let me know if your symptoms have improved.  If not we will go from there regarding referrals or further testing.  If you develop any blood in your stool, abdominal pain or worsening of symptoms then do not hesitate to be seen sooner.  Please follow-up with your doctor in 2 weeks by MyChart or sooner as needed if your symptoms worsen.  If you have any questions or concerns, please do not hesitate to call the office at [phone].  Take Care,   [place] Lameshia Hypolite, DO

## 2019-11-08 NOTE — Progress Notes (Signed)
   CHIEF COMPLAINT / HPI:  Diarrhea: Patient states that for the past year he has had issues with diarrhea. States that he had 7 episodes of runny diarrhea during the day.  He is concerned he may have IBS.  He denies any family history of known IBS, IBD or colon cancer.  He denies any blood in his stool, dark stools.  He states that about a week ago he does remember having one darker bowel movement but it resolved since then.  He denies any abdominal pain.  He does state that he has had issues with acid reflux over the past years and takes omeprazole 80 mg daily for this.  He states that when he runs out of that he will also take Nexium.  He has not tried any other medications for his acid reflux.  He states he has not tried any over-the-counter medications for his diarrhea.  He denies taking any laxatives.  PERTINENT  PMH / PSH: GERD  OBJECTIVE: BP 118/74   Pulse 99   Wt 147 lb (66.7 kg)   SpO2 99%   BMI 23.73 kg/m   General: NAD, pleasant Neck: Supple, no LAD Respiratory: normal work of breathing Gastrointestinal: soft, nontender, nondistended, normoactive BS Neuro: CN II-XII grossly intact Psych: AOx3, appropriate affect  ASSESSMENT / PLAN:  Diarrhea Chronic problem for 1 year.  No red flags including no unintentional weight loss, blood in stool, dark stools or family history of IBD or colon cancer.  Has not tried any over-the-counter medicines. Given that patient is on high dose of PPI will trial discontinuing this medication as it can sometimes cause diarrhea.   -Will start Prilosec for acid reflux in the meantime.   -Will also trial Imodium for diarrhea.   -Patient to follow-up in 2 weeks over my chart to discuss if he is having worsening acid reflux symptoms or worsening diarrhea.   -Red flags and strict return precautions discussed and patient voiced understanding.    [place] Illiana Losurdo, DO PGY-3, [facility]

## 2019-11-11 DIAGNOSIS — K219 Gastro-esophageal reflux disease without esophagitis: Secondary | ICD-10-CM | POA: Insufficient documentation

## 2019-11-11 DIAGNOSIS — K529 Noninfective gastroenteritis and colitis, unspecified: Secondary | ICD-10-CM | POA: Insufficient documentation

## 2019-11-11 DIAGNOSIS — R197 Diarrhea, unspecified: Secondary | ICD-10-CM | POA: Insufficient documentation

## 2019-11-11 NOTE — Assessment & Plan Note (Addendum)
Chronic problem for 1 year.  No red flags including no unintentional weight loss, blood in stool, dark stools or family history of IBD or colon cancer.  Has not tried any over-the-counter medicines. Given that patient is on high dose of PPI will trial discontinuing this medication as it can sometimes cause diarrhea.   -Will start Prilosec for acid reflux in the meantime.   -Will also trial Imodium for diarrhea.   -Patient to follow-up in 2 weeks over my chart to discuss if he is having worsening acid reflux symptoms or worsening diarrhea.   -Red flags and strict return precautions discussed and patient voiced understanding.

## 2019-12-12 IMAGING — DX DG CHEST 2V
2 series · 2 of 2 positions shown · non-contrast
Comparison: April 12, 2012

CLINICAL DATA: Nausea and vomiting.  Diarrhea.

EXAM:
CHEST - 2 VIEW

[chest pa]
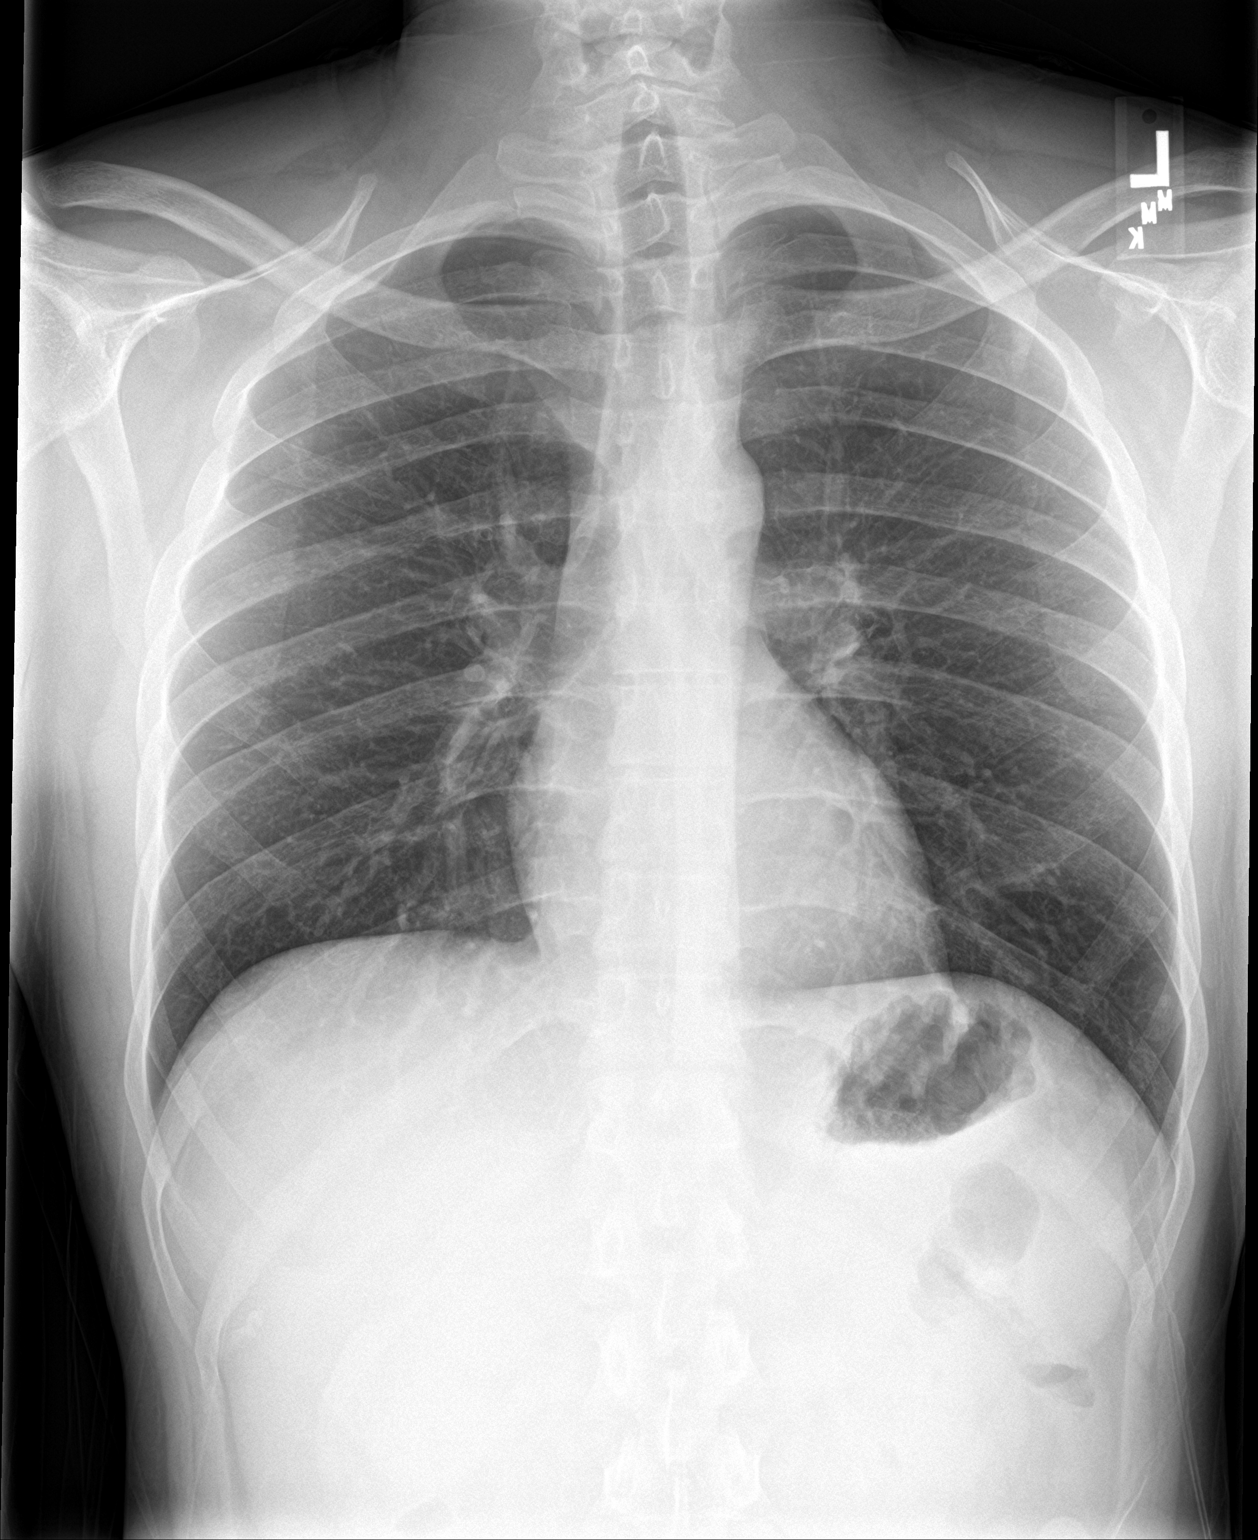

[chest lat]
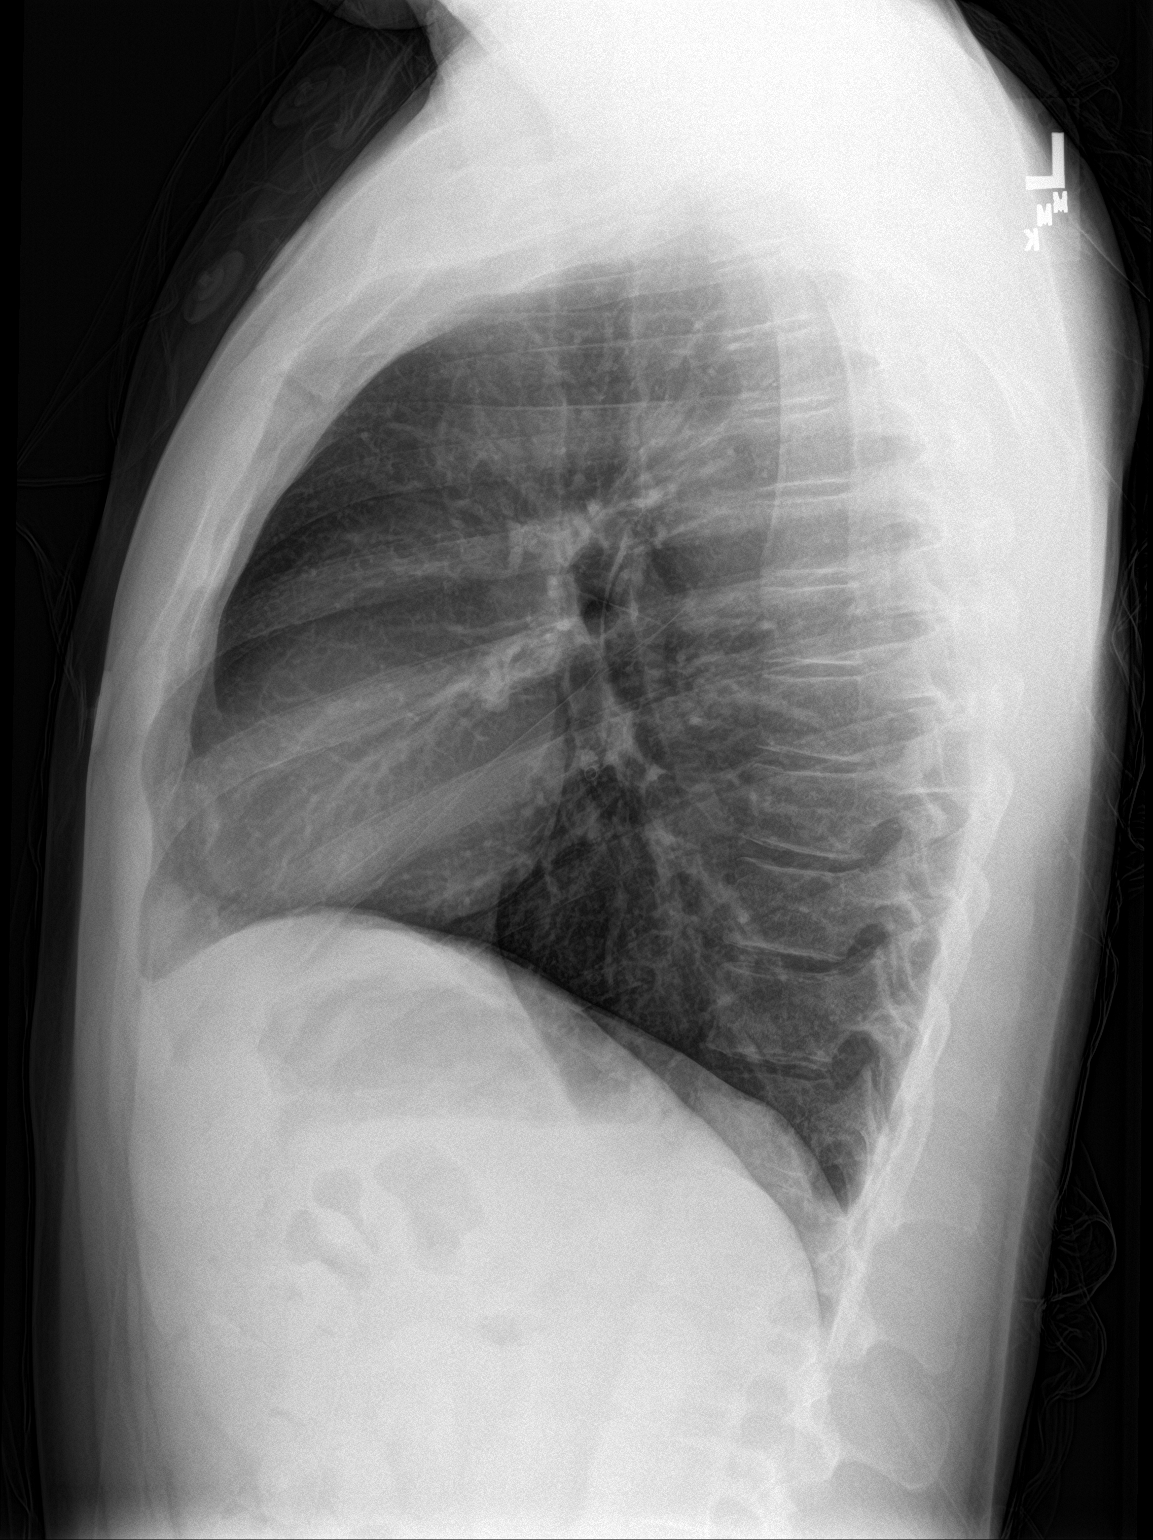

[2 of 2 positions shown; findings below may reference images not displayed]

FINDINGS: The heart size and mediastinal contours are within normal limits.
Both lungs are clear. The visualized skeletal structures are
unremarkable.
IMPRESSION: No active cardiopulmonary disease.

## 2020-02-04 ENCOUNTER — Other Ambulatory Visit: Payer: Self-pay | Admitting: Family Medicine

## 2020-02-04 DIAGNOSIS — K219 Gastro-esophageal reflux disease without esophagitis: Secondary | ICD-10-CM

## 2020-03-17 ENCOUNTER — Other Ambulatory Visit: Payer: Self-pay | Admitting: Family Medicine

## 2020-04-21 ENCOUNTER — Other Ambulatory Visit: Payer: Self-pay | Admitting: Family Medicine

## 2020-04-26 NOTE — Progress Notes (Signed)
    SUBJECTIVE:   CHIEF COMPLAINT / HPI:   Alcohol Use Disorder Patient presented with primary concern of insomnia.  States he has been drinking alcohol to help him sleep, 4 drinks (hard seltzer) a day. Also reports some difficulty with erections over the past 2 to 3 months. Also has had chronic issues of diarrhea, currently taking once a day and asking if he can increase Imodium to 3 times a day. Interested in cutting back on alcohol.  Covid vaccine Interested in getting the Covid vaccine today.  Denies any known adverse reactions to previous vaccines.  PERTINENT  PMH / PSH: Recently quit smoking 3 months ago.  A former 0.3-0.5 PPD smoker for 12 years.  OBJECTIVE:   BP (!) 148/82   Pulse 74   Wt 137 lb (62.1 kg)   SpO2 98%   BMI 22.11 kg/m   General: Well-appearing male, NAD CV: RRR, no murmurs Pulm: CTAB, no wheezes or rales  ASSESSMENT/PLAN:   Alcohol use disorder, mild, abuse Patient reports drinking 4 drinks a day every day to help him sleep.  Suspect his issues with his insomnia, erections, and diarrhea are all related to alcohol use.  Patient is interested in cutting back on alcohol. - gabapentin 100 mg qhs for alcohol cessation and sleep benefits - resources given - f/u 2-3 weeks   Encounter for immunization Patient given 1st dose of Pfizer Covid-19 vaccine today.  Elevated BP Blood pressure elevated this visit 148/82.  He has had some elevated readings in prior visits.  Suspect alcohol use is playing some role in this.  Will consider adding on an antihypertensive if still elevated at next visit.  We will plan for blood work at next visit (lipid panel, hepatitis C screening, HIV screening).  [name], MD [facility]

## 2020-04-27 ENCOUNTER — Other Ambulatory Visit: Payer: Self-pay

## 2020-04-27 ENCOUNTER — Encounter: Payer: Self-pay | Admitting: Family Medicine

## 2020-04-27 ENCOUNTER — Telehealth: Payer: Self-pay | Admitting: Family Medicine

## 2020-04-27 ENCOUNTER — Ambulatory Visit: Payer: BC Managed Care – PPO | Admitting: Family Medicine

## 2020-04-27 VITALS — BP 148/82 | HR 74 | Wt 137.0 lb

## 2020-04-27 DIAGNOSIS — F1010 Alcohol abuse, uncomplicated: Secondary | ICD-10-CM | POA: Insufficient documentation

## 2020-04-27 DIAGNOSIS — Z23 Encounter for immunization: Secondary | ICD-10-CM

## 2020-04-27 DIAGNOSIS — F10982 Alcohol use, unspecified with alcohol-induced sleep disorder: Secondary | ICD-10-CM | POA: Diagnosis not present

## 2020-04-27 MED ORDER — GABAPENTIN 100 MG PO CAPS: 100.0000 mg | ORAL_CAPSULE | Freq: Every day | ORAL | 0 refills | Status: DC

## 2020-04-27 MED ORDER — IPRATROPIUM BROMIDE 0.06 % NA SOLN: 2.0000 | Freq: Four times a day (QID) | NASAL | 12 refills | Status: DC

## 2020-04-27 NOTE — Patient Instructions (Addendum)
It was nice seeing you today, Juan Esparza.  Today, we talked about sleep and your alcohol use.  I suspect your issues of sleep, erections, and diarrhea are all related to alcohol.  I will start you on a new medication called gabapentin which will help with cutting back alcohol as well as sleep.  You should not take the Imodium more than once a day.  Your blood pressure was elevated this visit.   I would like to see you in 2 weeks to follow-up on sleep, alcohol, and your blood pressure.  If your blood pressure is still elevated, we may want to think about starting a medication to lower your blood pressure.  I am very happy you got your Covid vaccine today.  I will write you out for work tomorrow.  If you are feeling bad from the vaccine, you can take some Tylenol.  I would avoid taking any ibuprofen, Advil, Aleve, or other similar medications.   Stay well, Juan Deeds, MD   Alcoholics Anonymous:  [phone] [employer]Prov[employer].[email or web address].cy  Narcotics Anonymous:  [phone] 24 hour helpline: [phone])[phone]  Quit Smoking Hotline:  800-QUIT-NOW 816-238-6484)      Therapy and Counseling Resources Most providers on this list will take Medicaid. Patients with commercial insurance or Medicare should contact their insurance company to get a list of in network providers.  [facility]  [address], [address] [place], [place] [address]      [phone]  [facility] [address]  [place], [place] [address] [phone]  [facility] [address], [address]  [place], [place] [address]       [phone]      Jovita Kussmaul [facility] [address], [place], [place] [phone]  [facility]:  [address] [place] [phone]  [facility]:  [address] [place]iener [phone]  [facility] (under & uninsured) [address]   [place] [phone]    [email or web address]     [facility] [address] . [place]    [phone]  [facility] -[address]     Phone:  [phone]     [facility]-  [address]  [phone]   [facility] [address]      [place], [place] [phone]  [facility]: [address] [place] [address] [place], [place], [place]  [phone]   [facility] (Spanish therapist) [address] [place]  [address]   [place] [address]    [phone]    [facility]   [address],  [place], [place]  [phone]   [facility]  [address] [place]  [phone]  [facility]  [address] [place], [place]        [phone]  Open Access/Walk In Clinic under & uninsured  [facility]  [address] [place], [place] [phone] Crisis [phone]  [facility] [place],  (Spanish)   [address], [place]: [phone]) 8:30 - 12; 1 - 2:30  [facility] [employer],  [address] [place], [place]    [phone]):8:30 - 12; 2 - 3PM  [facility] [employer],  [address],  [place]; [phone]):   Mon - Fri 8 AM - 5 PM  [facility] [address] [place]  MWF 12:30 to 3:00 or call to schedule an appointment  [phone]  Specific Provider options Psychology Today  [email or web address] 1. click on find a therapist  2. enter your zip code 3. left side and select or tailor a therapist for your specific need.   [facility] Provider Directory [email or web address]  (Medicaid)   Follow all drop down to find a provider  Social Support program [facility] [place] [phone] or PhotoS[employer]pl [address], [place], [place] Recovery support and educational   24- Hour Availability:  .  [facility]  . [address] [place], [place] [employer] [phone] Crisis [phone]  . [facility] [phone]  [facility] Crisis Service  [phone]   . [facility] [employer]  [phone] (after hours)  . Therapeutic Alternative/Mobile Crisis   [phone]  . [place] National Suicide Hotline  469-456-0379 (TALK)  . Call 911 or go to emergency room  . [employer]  [phone]);  [place] [facility] [employer]   . [facility]  [phone]); [place], [place], [place], [place], [place], [place], [place]

## 2020-04-27 NOTE — Telephone Encounter (Signed)
Erroneous encounter

## 2020-04-27 NOTE — Assessment & Plan Note (Addendum)
Patient reports drinking 4 drinks a day every day to help him sleep.  Suspect his issues with his insomnia, erections, and diarrhea are all related to alcohol use.  Patient is interested in cutting back on alcohol. - gabapentin 100 mg qhs for alcohol cessation and sleep benefits - resources given - f/u 2-3 weeks

## 2020-05-22 ENCOUNTER — Other Ambulatory Visit: Payer: Self-pay

## 2020-05-22 ENCOUNTER — Other Ambulatory Visit: Payer: Self-pay | Admitting: Family Medicine

## 2020-05-22 ENCOUNTER — Ambulatory Visit: Payer: BC Managed Care – PPO | Admitting: Family Medicine

## 2020-05-22 ENCOUNTER — Ambulatory Visit: Payer: BC Managed Care – PPO

## 2020-05-22 VITALS — BP 140/90 | HR 88 | Ht 66.0 in | Wt 136.0 lb

## 2020-05-22 DIAGNOSIS — Z114 Encounter for screening for human immunodeficiency virus [HIV]: Secondary | ICD-10-CM

## 2020-05-22 DIAGNOSIS — F1010 Alcohol abuse, uncomplicated: Secondary | ICD-10-CM

## 2020-05-22 DIAGNOSIS — Z1159 Encounter for screening for other viral diseases: Secondary | ICD-10-CM | POA: Diagnosis not present

## 2020-05-22 DIAGNOSIS — Z23 Encounter for immunization: Secondary | ICD-10-CM

## 2020-05-22 DIAGNOSIS — Z13220 Encounter for screening for lipoid disorders: Secondary | ICD-10-CM | POA: Diagnosis not present

## 2020-05-22 DIAGNOSIS — Z0000 Encounter for general adult medical examination without abnormal findings: Secondary | ICD-10-CM | POA: Diagnosis not present

## 2020-05-22 DIAGNOSIS — G4700 Insomnia, unspecified: Secondary | ICD-10-CM

## 2020-05-22 DIAGNOSIS — F4321 Adjustment disorder with depressed mood: Secondary | ICD-10-CM

## 2020-05-22 MED ORDER — TRAZODONE HCL 50 MG PO TABS: 25.0000 mg | ORAL_TABLET | Freq: Every evening | ORAL | 0 refills | Status: DC | PRN

## 2020-05-22 MED ORDER — ACAMPROSATE CALCIUM 333 MG PO TBEC: 666.0000 mg | DELAYED_RELEASE_TABLET | Freq: Three times a day (TID) | ORAL | 0 refills | Status: DC

## 2020-05-22 NOTE — Assessment & Plan Note (Addendum)
Improving, still drinking 2-3 drinks per night.  Will trial acamprosate and discontinue gabapentin.  Encourage patient to seek out AA and therapy for help with alcohol cessation. - f/u 4 weeks

## 2020-05-22 NOTE — Assessment & Plan Note (Signed)
Depressed mood over the past few weeks due to multiple stressors.  Likely that alcohol use may be contributing.  Recommended patient seek out therapy.  We will hold off on medications for now, may consider if remains persistent.

## 2020-05-22 NOTE — Progress Notes (Signed)
    SUBJECTIVE:   CHIEF COMPLAINT / HPI:   Alcohol use disorder States he has cut back slightly from 4 drinks a night to 2-3 drinks a night.  Still having difficulty sleeping, does not feel gabapentin has been helping. Works second shift at [employer].  Depressed mood Reporting depressed mood over the past few weeks.  Has a lot of stress at work, has some concerns about customers at work, many of them not wearing masks. Fianc's mother recently had to be put in a nursing home, another source of stress for him. States he has a supportive relationship with his fiance.  Insomnia Still having difficulty sleeping and is requesting a sleep study. He is known to snore at night and apparently has a history of apneic episodes, but has not been having them recently.  Elevated BP Hypertensive at last office visit 148/82. Has been checking his BP at home with a wrist cuff, states his BPs at home normally 120s-130s/80s.  Health maintenance -receiving 2nd dose of Pfizer covid vaccine today -needs HIV, Hep C screening  PERTINENT  PMH / PSH: alcohol use disorder, GERD  OBJECTIVE:   BP 140/90   Pulse 88   Ht 5\' 6"  (1.676 m)   Wt 136 lb (61.7 kg)   SpO2 97%   BMI 21.95 kg/m   General: Well-appearing male, NAD HEENT: PERRL, EOMI, MMM, TMs clear bilaterally Neck: supple, no LAD CV: RRR, no murmurs Pulm: CTAB, no wheezes or rales Abd: soft, non-tender, +BS Neuro: 5/5 strength bilaterally  Depression screen [facility] 2/9 05/22/2020 04/27/2020 11/08/2019  Decreased Interest 1 1 1   Down, Depressed, Hopeless 2 1 0  PHQ - 2 Score 3 2 1   Altered sleeping 3 3 -  Tired, decreased energy 1 3 -  Change in appetite 1 1 -  Feeling bad or failure about yourself  2 1 -  Trouble concentrating 2 2 -  Moving slowly or fidgety/restless 1 2 -  Suicidal thoughts 0 0 -  PHQ-9 Score 13 14 -  Difficult doing work/chores Somewhat difficult Not difficult at all -    ASSESSMENT/PLAN:   Alcohol use disorder, mild,  abuse Improving, still drinking 2-3 drinks per night.  Will trial acamprosate and discontinue gabapentin.  Encourage patient to seek out AA and therapy for help with alcohol cessation. - f/u 4 weeks  Adjustment disorder with depressed mood Depressed mood over the past few weeks due to multiple stressors.  Likely that alcohol use may be contributing.  Recommended patient seek out therapy.  We will hold off on medications for now, may consider if remains persistent.   Elevated BP Elevated reading today 140/90.  Normal readings at home.  Suspect a combination of alcohol use, anxiety, and whitecoat hypertension.  We will continue to monitor for now and will hold off on starting medications.  Insomnia Suspect mostly related to alcohol use and underlying mood disorder.  Though patient has normal BMI, there is a possibility he could have underlying sleep apnea given snoring and history of apneic episodes.   - sleep study - start trazodone prn  Covid vaccine Patient received second dose of Pfizer Covid vaccine today without any adverse reactions.  Health maintenance - screening labs ordered: HIV, hep C, lipid panel  01/08/2020, MD [facility]

## 2020-05-22 NOTE — Patient Instructions (Addendum)
It was nice seeing you today, Edrian.  Today, we talked about alcohol, mood, sleep, and blood pressure.  I think a lot of your issues are related to your alcohol use and mood. Continue cutting back the alcohol as you have been doing. I have started you on a new medication called acamprosate to help with alcohol. You will need to take this 3 times a day. I also recommend going to therapy to help with your mood and cutting back alcohol. AA is a Chief Technology Officer as well.   I have ordered you a sleep study. Expect to receive a call from the [facility].  I have also prescribed you a medication called trazodone to help with sleep. You can take 1/2 to a full tablet every night as needed for sleep.  I am glad you got the covid vaccine. I have written you out of work tomorrow. If you feel bad, you can take Tylenol. I would avoid any ibuprofen or Advil.  I will update you with the results of your blood work.  Please return for follow-up visit in 1 month.  Stay well, [name], MD

## 2020-05-23 LAB — LIPID PANEL
Chol/HDL Ratio: 4 ratio (ref 0.0–5.0)
Cholesterol, Total: 234 mg/dL — ABNORMAL HIGH (ref 100–199)
HDL: 58 mg/dL (ref >39)
LDL Chol Calc (NIH): 95 mg/dL (ref 0–99)
Triglycerides: 489 mg/dL — ABNORMAL HIGH (ref 0–149)
VLDL Cholesterol Cal: 81 mg/dL — ABNORMAL HIGH (ref 5–40)

## 2020-05-23 LAB — HCV INTERPRETATION

## 2020-05-23 LAB — HCV AB W REFLEX TO QUANT PCR: HCV Ab: <0.1 s/co ratio (ref 0.0–0.9)

## 2020-05-23 LAB — HIV ANTIBODY (ROUTINE TESTING W REFLEX): HIV Screen 4th Generation wRfx: NONREACTIVE

## 2020-05-24 ENCOUNTER — Telehealth: Payer: Self-pay | Admitting: Family Medicine

## 2020-05-24 NOTE — Telephone Encounter (Signed)
Called patient to discuss lab results. Discussed high total cholesterol and triglycerides. Counseled on diet changes (reducing carbohydrates, increasing fiber, reducing saturated fats/fried foods, and continue cutting back alcohol).

## 2020-06-05 ENCOUNTER — Other Ambulatory Visit: Payer: Self-pay | Admitting: Family Medicine

## 2020-06-05 DIAGNOSIS — G4700 Insomnia, unspecified: Secondary | ICD-10-CM

## 2020-06-07 ENCOUNTER — Other Ambulatory Visit: Payer: Self-pay | Admitting: Family Medicine

## 2020-06-07 DIAGNOSIS — G4700 Insomnia, unspecified: Secondary | ICD-10-CM

## 2020-06-19 ENCOUNTER — Other Ambulatory Visit: Payer: Self-pay | Admitting: Family Medicine

## 2020-06-19 DIAGNOSIS — F1010 Alcohol abuse, uncomplicated: Secondary | ICD-10-CM

## 2020-07-17 ENCOUNTER — Other Ambulatory Visit: Payer: Self-pay | Admitting: Family Medicine

## 2020-07-17 DIAGNOSIS — F1010 Alcohol abuse, uncomplicated: Secondary | ICD-10-CM

## 2020-07-17 DIAGNOSIS — K219 Gastro-esophageal reflux disease without esophagitis: Secondary | ICD-10-CM

## 2020-07-17 DIAGNOSIS — G4700 Insomnia, unspecified: Secondary | ICD-10-CM

## 2020-07-17 MED ORDER — OMEPRAZOLE 40 MG PO CPDR: 80.0000 mg | DELAYED_RELEASE_CAPSULE | Freq: Every day | ORAL | 0 refills | Status: DC

## 2020-08-19 ENCOUNTER — Other Ambulatory Visit: Payer: Self-pay | Admitting: Family Medicine

## 2020-08-19 DIAGNOSIS — G4700 Insomnia, unspecified: Secondary | ICD-10-CM

## 2020-08-24 ENCOUNTER — Other Ambulatory Visit: Payer: Self-pay | Admitting: Family Medicine

## 2020-08-24 DIAGNOSIS — F1010 Alcohol abuse, uncomplicated: Secondary | ICD-10-CM

## 2020-09-10 ENCOUNTER — Other Ambulatory Visit: Payer: Self-pay

## 2020-09-10 ENCOUNTER — Ambulatory Visit (HOSPITAL_BASED_OUTPATIENT_CLINIC_OR_DEPARTMENT_OTHER): Payer: BC Managed Care – PPO | Attending: Family Medicine | Admitting: Internal Medicine

## 2020-09-10 VITALS — Ht 66.0 in | Wt 136.0 lb

## 2020-09-10 DIAGNOSIS — G4700 Insomnia, unspecified: Secondary | ICD-10-CM | POA: Diagnosis not present

## 2020-09-10 DIAGNOSIS — R0683 Snoring: Secondary | ICD-10-CM | POA: Diagnosis not present

## 2020-09-16 ENCOUNTER — Other Ambulatory Visit: Payer: Self-pay | Admitting: Family Medicine

## 2020-09-16 DIAGNOSIS — G4700 Insomnia, unspecified: Secondary | ICD-10-CM

## 2020-09-18 DIAGNOSIS — G4700 Insomnia, unspecified: Secondary | ICD-10-CM | POA: Diagnosis not present

## 2020-09-18 NOTE — Procedures (Signed)
   Patient Name: Juan Esparza, Juan Esparza Date: 09/10/2020 Gender: Male D.O.B: 10-28-1980 Age (years): 39 Referring Provider: Leighton Roach McDiarmid Height (inches): 66 Interpreting Physician: Jetty Duhamel MD, ABSM Weight (lbs): 136 RPSGT: Cherylann Parr BMI: 22 MRN: [id number] Neck Size: 15.00  CLINICAL INFORMATION Sleep Study Type: NPSG Indication for sleep study: Fatigue, Snoring, Witnessed Apnea / Gasping During Sleep Epworth Sleepiness Score: 1  SLEEP STUDY TECHNIQUE As per the AASM Manual for the Scoring of Sleep and Associated Events v2.3 (April 2016) with a hypopnea requiring 4% desaturations.  The channels recorded and monitored were frontal, central and occipital EEG, electrooculogram (EOG), submentalis EMG (chin), nasal and oral airflow, thoracic and abdominal wall motion, anterior tibialis EMG, snore microphone, electrocardiogram, and pulse oximetry.  MEDICATIONS Medications self-administered by patient taken the night of the study : Trazodone 100 mg  SLEEP ARCHITECTURE The study was initiated at 9:54:04 PM and ended at 5:31:50 AM.  Sleep onset time was 269.0 minutes and the sleep efficiency was 29.8%%. The total sleep time was 136.3 minutes.  Stage REM latency was 112.0 minutes.  The patient spent 7.7%% of the night in stage N1 sleep, 68.8%% in stage N2 sleep, 0.0%% in stage N3 and 23.5% in REM.  Alpha intrusion was absent.  Supine sleep was 1.10%.  RESPIRATORY PARAMETERS The overall apnea/hypopnea index (AHI) was 0.0 per hour. There were 0 total apneas, including 0 obstructive, 0 central and 0 mixed apneas. There were 0 hypopneas and 0 RERAs.  The AHI during Stage REM sleep was 0.0 per hour.  AHI while supine was 0.0 per hour.  The mean oxygen saturation was 95.4%. The minimum SpO2 during sleep was 93.0%.  moderate snoring was noted during this study.  CARDIAC DATA The 2 lead EKG demonstrated sinus rhythm. The mean heart rate was 80.2 beats per minute. Other EKG  findings include: None.  LEG MOVEMENT DATA The total PLMS were 0 with a resulting PLMS index of 0.0. Associated arousal with leg movement index was 0.0 .  IMPRESSIONS - No significant obstructive sleep apnea occurred during this study (AHI = 0.0/h). - No significant central sleep apnea occurred during this study (CAI = 0.0/h). - The patient had minimal or no oxygen desaturation during the study (Min O2 = 93.0%) - The patient snored with loud snoring volume. - No cardiac abnormalities were noted during this study. - Clinically significant periodic limb movements did not occur during sleep. No significant associated arousals. - Patient had significant difficulty initiating and maintaining sleep, with sustained sleep onset only at 3:20 AM.  DIAGNOSIS - Insomnia - Primary snoring  RECOMMENDATIONS - Manage for insomnia based on clinical judgment. Consider Cognitive Behavioral Therapy as well as medication and counseling approaches.  - Sleep hygiene should be reviewed to assess factors that may improve sleep quality. - Manage for snoring as needed. - Weight management and regular exercise should be initiated or continued if appropriate.  [Electronically signed] 09/18/2020 02:53 PM  Jetty Duhamel MD, ABSM Diplomate, American Board of Sleep Medicine   NPI: [id number]                         Jetty Duhamel Diplomate, American Board of Sleep Medicine  ELECTRONICALLY SIGNED ON:  09/18/2020, 2:49 PM [facility] PH: [phone]   FX: [phone] ACCREDITED BY THE AMERICAN ACADEMY OF SLEEP MEDICINE

## 2020-10-06 ENCOUNTER — Other Ambulatory Visit: Payer: Self-pay | Admitting: Family Medicine

## 2020-10-06 DIAGNOSIS — F1010 Alcohol abuse, uncomplicated: Secondary | ICD-10-CM

## 2020-10-17 ENCOUNTER — Other Ambulatory Visit: Payer: Self-pay | Admitting: Family Medicine

## 2020-10-17 DIAGNOSIS — G4700 Insomnia, unspecified: Secondary | ICD-10-CM

## 2020-10-30 ENCOUNTER — Other Ambulatory Visit: Payer: Self-pay | Admitting: Family Medicine

## 2020-10-30 DIAGNOSIS — F1010 Alcohol abuse, uncomplicated: Secondary | ICD-10-CM

## 2020-11-05 ENCOUNTER — Other Ambulatory Visit: Payer: Self-pay | Admitting: Family Medicine

## 2020-11-05 DIAGNOSIS — G4700 Insomnia, unspecified: Secondary | ICD-10-CM

## 2020-11-05 DIAGNOSIS — K219 Gastro-esophageal reflux disease without esophagitis: Secondary | ICD-10-CM

## 2020-11-20 ENCOUNTER — Ambulatory Visit: Payer: BC Managed Care – PPO | Admitting: Family Medicine

## 2020-12-01 ENCOUNTER — Encounter: Payer: Self-pay | Admitting: Family Medicine

## 2020-12-01 ENCOUNTER — Telehealth: Payer: Self-pay | Admitting: Family Medicine

## 2020-12-01 NOTE — Telephone Encounter (Signed)
Pt was no show for appt 11/20/2020 for new patient visit. 1st occurrence. Fee waived. Letter mailed.

## 2020-12-05 ENCOUNTER — Other Ambulatory Visit: Payer: Self-pay | Admitting: Family Medicine

## 2020-12-05 DIAGNOSIS — F1010 Alcohol abuse, uncomplicated: Secondary | ICD-10-CM

## 2020-12-19 ENCOUNTER — Other Ambulatory Visit: Payer: Self-pay | Admitting: Family Medicine

## 2020-12-19 DIAGNOSIS — G4700 Insomnia, unspecified: Secondary | ICD-10-CM

## 2020-12-19 DIAGNOSIS — F1010 Alcohol abuse, uncomplicated: Secondary | ICD-10-CM

## 2020-12-20 ENCOUNTER — Other Ambulatory Visit: Payer: Self-pay | Admitting: Family Medicine

## 2020-12-20 DIAGNOSIS — K219 Gastro-esophageal reflux disease without esophagitis: Secondary | ICD-10-CM

## 2020-12-25 ENCOUNTER — Ambulatory Visit: Payer: BC Managed Care – PPO | Admitting: Family Medicine

## 2020-12-25 ENCOUNTER — Encounter: Payer: Self-pay | Admitting: Family Medicine

## 2020-12-25 ENCOUNTER — Other Ambulatory Visit: Payer: Self-pay

## 2020-12-25 VITALS — BP 130/78 | HR 75 | Temp 97.9°F | Ht 66.5 in | Wt 149.0 lb

## 2020-12-25 DIAGNOSIS — K219 Gastro-esophageal reflux disease without esophagitis: Secondary | ICD-10-CM | POA: Diagnosis not present

## 2020-12-25 DIAGNOSIS — F419 Anxiety disorder, unspecified: Secondary | ICD-10-CM | POA: Diagnosis not present

## 2020-12-25 DIAGNOSIS — Z23 Encounter for immunization: Secondary | ICD-10-CM

## 2020-12-25 DIAGNOSIS — J302 Other seasonal allergic rhinitis: Secondary | ICD-10-CM

## 2020-12-25 DIAGNOSIS — F5104 Psychophysiologic insomnia: Secondary | ICD-10-CM | POA: Insufficient documentation

## 2020-12-25 DIAGNOSIS — K029 Dental caries, unspecified: Secondary | ICD-10-CM

## 2020-12-25 MED ORDER — IPRATROPIUM BROMIDE 0.06 % NA SOLN: 2.0000 | Freq: Four times a day (QID) | NASAL | 12 refills | Status: DC

## 2020-12-25 MED ORDER — CETIRIZINE HCL 10 MG PO CAPS: 10.0000 mg | ORAL_CAPSULE | Freq: Every day | ORAL | 2 refills | Status: DC

## 2020-12-25 MED ORDER — PAROXETINE HCL 20 MG PO TABS: ORAL_TABLET | ORAL | 0 refills | Status: DC

## 2020-12-25 NOTE — Progress Notes (Signed)
[facility] [address] [place] [address] Dept: [phone] Dept Fax: [phone]  Transfer of Care Office Visit  Subjective:    Patient ID: Juan Esparza, male    DOB: 11-12-1980, 40 y.o..   MRN: [id number]  Chief Complaint  Patient presents with  . Establish Care    NP- establish care. C/o has lingering cough since having covid in February.  No OTC medications taken.     History of Present Illness:  Patient is in today to establish care. Juan Esparza is originally from [place]. He has a fianc and a 52 year-old daughter. 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 Juan Esparza notes that he quit smoking tobacco about 1 year ago, but does continue to vape nicotine regularly. He admits to occasional alcohol use, but on questioning notes that he drinks 2-3 drinks each night and has 4-5 drinks on weekends. He describes his drinking as being a reaction to insomnia issues. He denies any drug use.  Juan Esparza has a history of chronic insomnia. He states that one of his frustrations with his previous PCP was that hefelt they did not take his sleep issues seriously. He was sent for a sleep study, which he notes did not show sleep apnea. However, he notes his issue is related to difficulty falling asleep. He works until 10 o'clock in the evening. He finds when he gets home it is difficult for him to relax and then fall asleep. He finds he drinks alcohol to try ad relieve the stress he is feeling. He perceives his daily work life as being very stressful. He notes that he lies awake watching TV or reading for hours before he can finally go to sleep. He admits that more recently, he has been coming to bed and lying awake rather than reading or watching TV in another room. This is to please his fianc. He has been treated with trazadone, but has not found this helped his symptoms.  Juan Esparza had been trying to reduce his alcohol use.  He notes that when taking acamprosate, he was initially able to be abstinent for 3 weeks, but then relapsed to drinking due to the insomnia. He identifies part of his issue being with the death of two uncles int he past few years. He viewed one of these as a father-figure and has struggled with his loss. He admits to feelings of nervousness and anxiety at times. He also notes some obsessiveness with keeping things in the home neat and orderly. However, he does feel that this interferes with his work life or his relationship. He does feel less stress when things are orderly.  Juan Esparza has a history of seasonal allergies. He is managed with periodic Zyrtec and Atrovent nasal spray. He notes he currently needs a refill of these.  Juan Esparza has a history of GERD and some occasional diarrhea issues. He uses Prilosec for his stomach acid and PRN loperamide to reduce the diarrhea.  Juan Esparza notes he had COVID-19 a few months ago. He has had a lingering cough since that time. He notes it has improved some, but not resolved.  Past Medical History: Patient Active Problem List   Diagnosis Date Noted  . Chronic insomnia 12/25/2020  . Anxiety 12/25/2020  . Seasonal allergies 12/25/2020  . Alcohol use disorder, mild, abuse 04/27/2020  . GERD (gastroesophageal reflux disease) 11/11/2019  . Diarrhea 11/11/2019   History reviewed. No pertinent surgical  history.  Family History  Problem Relation Age of Onset  . Asthma Mother   . Depression Mother   . Stroke Mother   . Alcohol abuse Father   . Drug abuse Father   . Asthma Sister    Outpatient Medications Prior to Visit  Medication Sig Dispense Refill  . famotidine (PEPCID) 40 MG tablet TAKE 1 TABLET BY MOUTH AT BEDTIME 30 tablet 0  . loperamide (IMODIUM A-D) 2 MG tablet Take 1 tablet (2 mg total) by mouth 3 (three) times daily as needed for diarrhea or loose stools. 90 tablet 0  . omeprazole (PRILOSEC) 40 MG capsule TAKE 2 CAPSULES BY MOUTH  EVERY DAY 60 capsule 0  . traZODone (DESYREL) 50 MG tablet TAKE 1/2 TO 1 TABLET BY MOUTH AT BEDTIME AS NEEDED FOR SLEEP 30 tablet 0  . acamprosate (CAMPRAL) 333 MG tablet TAKE 2 TABLETS BY MOUTH 3 TIMES A DAY 180 tablet 0  . Cetirizine HCl 10 MG CAPS Take 1 capsule (10 mg total) by mouth daily for 15 days. 15 capsule 0  . ipratropium (ATROVENT) 0.06 % nasal spray Place 2 sprays into both nostrils 4 (four) times daily. 15 mL 12   No facility-administered medications prior to visit.   No Known Allergies    Objective:   Today's Vitals   12/25/20 1511  BP: 130/78  Pulse: 75  Temp: 97.9 F (36.6 C)  TempSrc: Temporal  SpO2: 97%  Weight: 149 lb (67.6 kg)  Height: 5' 6.5" (1.689 m)   Body mass index is 23.69 kg/m.   General: Well developed, well nourished. No acute distress. HEENT: Normocephalic, non-traumatic. PERRL, EOMI. Conjunctiva clear. Fundiscopic exam shows normal disc and    vasculature. External ears normal. EAC and TMs normal bilaterally. Nose clear without congestion or rhinorrhea.   Mucous membranes moist. Oropharynx clear. There are two upper molars on the right that are eroded down to the    gumline. Neck: Supple. No lymphadenopathy. No thyromegaly. Lungs: Clear to auscultation bilaterally. No wheezing, rales or rhonchi. CV: RRR without murmurs or rubs. Pulses 2+ bilaterally. Abdomen: Soft, non-tender. Bowel sounds positive, normal pitch and frequency. No hepatosplenomegaly. No rebound  or guarding. Back: Straight. No CVA tenderness bilaterally. Extremities: Full ROM. No joint swelling or tenderness. No edema noted. Feet: There is some peeling of the skin on the sole of the feet. Psych: Alert and oriented. Normal mood and affect.  Health Maintenance Due  Topic Date Due  . TETANUS/TDAP  Never done  . COVID-19 Vaccine (3 - Booster for Pfizer series) 11/19/2020     Sleep Study (09/10/2020) IMPRESSIONS - No significant obstructive sleep apnea occurred during this  study (AHI = 0.0/h). - No significant central sleep apnea occurred during this study (CAI = 0.0/h). - The patient had minimal or no oxygen desaturation during the study (Min O2 = 93.0%) - The patient snored with loud snoring volume. - No cardiac abnormalities were noted during this study. - Clinically significant periodic limb movements did not occur during sleep. No significant associated arousals. - Patient had significant difficulty initiating and maintaining sleep, with sustained sleep onset only at 3:20 AM.  Assessment & Plan:   1. Chronic insomnia Juan Esparza has chronic insomnia. He has been treated with trazadone, but has not found it to help. By history, I am concerned that this is being impacted by his stressful work situation, his use of nicotine, and his moderately heavy alcohol intake. I also feel he may have an underling mood  disorder, probably anxiety. He has some obsessive tendencies, but I don't think these represent a disorder. He appears to have some unresolved issues related to his uncles' deaths. We did discuss that CBT-I has a better impact on resolving insomnia. I did counsel him on using the bed only for sleep or sex. We discussed him reading in another room until he gets sleepy and then coming to bed. I will refer him for counseling to see if this can help his sleep.  - Ambulatory referral to Psychology  2. Anxiety Juan Esparza appears to have an underlying anxiety issue. He is willing to give a trial of medication to see if this improves his anxious feelings. We will try Paxil and I will see him back in 6 weeks.  - PARoxetine (PAXIL) 20 MG tablet; Take 0.5 tablets (10 mg total) by mouth daily for 6 days, THEN 1 tablet (20 mg total) daily.  Dispense: 39 tablet; Refill: 0 - Ambulatory referral to Psychology  3. Gastroesophageal reflux disease without esophagitis Stable on Prilosec.  4. Seasonal allergies Stable on cetrizine and Atrovent nasal spray. I will renew these  today.  - Cetirizine HCl 10 MG CAPS; Take 1 capsule (10 mg total) by mouth daily.  Dispense: 30 capsule; Refill: 2 - ipratropium (ATROVENT) 0.06 % nasal spray; Place 2 sprays into both nostrils 4 (four) times daily.  Dispense: 15 mL; Refill: 12  5. Dental caries Recommend dental follow-up/  6. Need for Tdap vaccination  - Tdap vaccine greater than or equal to 7yo IM    Loyola Mast, MD

## 2021-01-18 ENCOUNTER — Other Ambulatory Visit: Payer: Self-pay | Admitting: Family Medicine

## 2021-01-18 ENCOUNTER — Other Ambulatory Visit: Payer: Self-pay

## 2021-01-18 DIAGNOSIS — F419 Anxiety disorder, unspecified: Secondary | ICD-10-CM

## 2021-01-18 DIAGNOSIS — J302 Other seasonal allergic rhinitis: Secondary | ICD-10-CM

## 2021-01-18 NOTE — Progress Notes (Unsigned)
Patient is requesting a refill on Paxil 20mg .

## 2021-01-22 ENCOUNTER — Other Ambulatory Visit: Payer: Self-pay | Admitting: Family Medicine

## 2021-01-22 DIAGNOSIS — F419 Anxiety disorder, unspecified: Secondary | ICD-10-CM

## 2021-01-23 ENCOUNTER — Encounter: Payer: Self-pay | Admitting: Family Medicine

## 2021-01-23 DIAGNOSIS — F5104 Psychophysiologic insomnia: Secondary | ICD-10-CM

## 2021-01-24 MED ORDER — TRAZODONE HCL 50 MG PO TABS: 25.0000 mg | ORAL_TABLET | Freq: Every evening | ORAL | 6 refills | Status: DC | PRN

## 2021-01-30 ENCOUNTER — Other Ambulatory Visit: Payer: Self-pay | Admitting: Family Medicine

## 2021-01-30 DIAGNOSIS — K219 Gastro-esophageal reflux disease without esophagitis: Secondary | ICD-10-CM

## 2021-01-30 DIAGNOSIS — F5104 Psychophysiologic insomnia: Secondary | ICD-10-CM

## 2021-01-30 NOTE — Telephone Encounter (Signed)
It appears he has transferred his care elsewhere so requests should go to his new PCP.

## 2021-02-12 ENCOUNTER — Ambulatory Visit: Payer: BC Managed Care – PPO | Admitting: Family Medicine

## 2021-02-12 DIAGNOSIS — Z0289 Encounter for other administrative examinations: Secondary | ICD-10-CM

## 2021-03-09 ENCOUNTER — Encounter: Payer: Self-pay | Admitting: Family Medicine

## 2021-03-16 ENCOUNTER — Telehealth: Payer: Self-pay | Admitting: Family Medicine

## 2021-03-16 DIAGNOSIS — F1010 Alcohol abuse, uncomplicated: Secondary | ICD-10-CM

## 2021-03-20 NOTE — Telephone Encounter (Signed)
Lft VM to rtn call to find out which medication he is needing. Dm/cma

## 2021-03-20 NOTE — Telephone Encounter (Addendum)
Pt has been out of this medication for 3 weeks and needs filled, please advise. Pt would like call back when this is resolved

## 2021-03-20 NOTE — Telephone Encounter (Addendum)
Pt called back asking for his Acamprosate Calcium 333 MG. He said Dr Wynelle Link used to write it for him and he is out of medication for 3 weeks.

## 2021-03-22 MED ORDER — ACAMPROSATE CALCIUM 333 MG PO TBEC: 666.0000 mg | DELAYED_RELEASE_TABLET | Freq: Three times a day (TID) | ORAL | 2 refills | Status: DC

## 2021-03-22 NOTE — Telephone Encounter (Signed)
Lft detailed VM that RX was sent to the pharmacy and advise to keep upcoming appt. Dm/cma

## 2021-03-22 NOTE — Addendum Note (Signed)
Addended by: Loyola Mast on: 03/22/2021 12:19 PM   Modules accepted: Orders

## 2021-03-23 ENCOUNTER — Other Ambulatory Visit: Payer: Self-pay

## 2021-03-26 ENCOUNTER — Ambulatory Visit: Payer: BC Managed Care – PPO | Admitting: Family Medicine

## 2021-03-26 ENCOUNTER — Encounter: Payer: Self-pay | Admitting: Family Medicine

## 2021-03-26 ENCOUNTER — Other Ambulatory Visit: Payer: Self-pay

## 2021-03-26 VITALS — BP 124/80 | HR 85 | Temp 97.8°F | Ht 66.5 in | Wt 149.8 lb

## 2021-03-26 DIAGNOSIS — N522 Drug-induced erectile dysfunction: Secondary | ICD-10-CM

## 2021-03-26 DIAGNOSIS — F419 Anxiety disorder, unspecified: Secondary | ICD-10-CM | POA: Diagnosis not present

## 2021-03-26 DIAGNOSIS — F5104 Psychophysiologic insomnia: Secondary | ICD-10-CM | POA: Diagnosis not present

## 2021-03-26 DIAGNOSIS — F1010 Alcohol abuse, uncomplicated: Secondary | ICD-10-CM | POA: Diagnosis not present

## 2021-03-26 MED ORDER — SILDENAFIL CITRATE 20 MG PO TABS: ORAL_TABLET | ORAL | 0 refills | Status: DC

## 2021-03-26 MED ORDER — HYDROXYZINE PAMOATE 25 MG PO CAPS: 25.0000 mg | ORAL_CAPSULE | Freq: Every evening | ORAL | 6 refills | Status: DC | PRN

## 2021-03-26 NOTE — Patient Instructions (Signed)
May use melatonin to help with sleep. Take as directed on bottle.

## 2021-03-26 NOTE — Progress Notes (Signed)
[facility] [address] [place] [address] Dept: [phone] Dept Fax: [phone]  Office Visit  Subjective:    Patient ID: Juan Esparza, male    DOB: [birth year], 40 y.o..   MRN: [id number]  Chief Complaint  Patient presents with   Follow-up    F/u meds.      History of Present Illness:  Patient is in today for reassessment. He notes that he is having some increased anxiety recently. He has learned that he will be moved to 1st shift. He is anxious about how this might impact his sleep, as he has always worked 2nd or 3rd shift. He continues to have some sleep difficulties and wonders what else might help.  He admits that he has not been taking his Paxil consistently. He was unsure about what time of the day he should take it. He also has had some concern about issues this has been giving him related to erectile issues. He notes he and his fiance typically have sex almost daily. It was around the time of starting the Paxil that he started having trouble.  Juan Esparza has had a history of alcohol abuse. His prior physician had treated with with Campral. He notes that he has continued to take this. He had one relapse since that time, but notes overall this has increased his number of abstinent days.  Past Medical History: Patient Active Problem List   Diagnosis Date Noted   Chronic insomnia 12/25/2020   Anxiety 12/25/2020   Seasonal allergies 12/25/2020   Alcohol use disorder, mild, abuse 04/27/2020   GERD (gastroesophageal reflux disease) 11/11/2019   Diarrhea 11/11/2019   No past surgical history on file.  Family History  Problem Relation Age of Onset   Asthma Mother    Depression Mother    Stroke Mother    Alcohol abuse Father    Drug abuse Father    Asthma Sister    Outpatient Medications Prior to Visit  Medication Sig Dispense Refill   acamprosate (CAMPRAL) 333 MG tablet Take 2 tablets (666 mg total) by  mouth 3 (three) times daily. 180 tablet 2   Cetirizine HCl 10 MG CAPS Take 1 capsule (10 mg total) by mouth daily. 30 capsule 2   famotidine (PEPCID) 40 MG tablet TAKE 1 TABLET BY MOUTH AT BEDTIME 30 tablet 0   ipratropium (ATROVENT) 0.06 % nasal spray Place 2 sprays into both nostrils 4 (four) times daily. 15 mL 12   loperamide (IMODIUM A-D) 2 MG tablet Take 1 tablet (2 mg total) by mouth 3 (three) times daily as needed for diarrhea or loose stools. 90 tablet 0   omeprazole (PRILOSEC) 40 MG capsule TAKE 2 CAPSULES BY MOUTH EVERY DAY 60 capsule 0   PARoxetine (PAXIL) 20 MG tablet Take 1 tablet (20 mg total) by mouth daily. 90 tablet 3   traZODone (DESYREL) 50 MG tablet Take 0.5-1 tablets (25-50 mg total) by mouth at bedtime as needed. for sleep 30 tablet 6   No facility-administered medications prior to visit.   No Known Allergies    Objective:   Today's Vitals   03/26/21 1420  BP: 124/80  Pulse: 85  Temp: 97.8 F (36.6 C)  TempSrc: Temporal  SpO2: 98%  Weight: 149 lb 12.8 oz (67.9 kg)  Height: 5' 6.5" (1.689 m)   Body mass index is 23.82 kg/m.   General: Well developed, well nourished. No acute distress. Psych: Alert and oriented x3. Normal mood and  affect.  Health Maintenance Due  Topic Date Due   Pneumococcal Vaccine 9-11 Years old (1 - PCV) Never done   COVID-19 Vaccine (3 - Booster for Pfizer series) 10/22/2020     Assessment & Plan:   1. Alcohol use disorder, mild, abuse Juan Esparza appears to be having a positive response to Campral. I will plan to continue this moving forward. I sis recommend he consider attending AA, as another approach to long-term sobriety. I had previously referred him for counseling, but he apparently did not respond to phone messages for scheduling. I will renew his referral.  - Ambulatory referral to Psychology  2. Chronic insomnia We discussed options for improving sleep. He recalled our previous discussion on sleep hygiene. I added to  this a recommendation to get up at a consistent time each day. I am hopeful he can get into a good sleep routine with the move to 1st shift. I suggested he consider adding hydroxyzine and melatonin to his trazodone to help with his sleep. I will reassess him in 1 month.  - hydrOXYzine (VISTARIL) 25 MG capsule; Take 1 capsule (25 mg total) by mouth at bedtime as needed.  Dispense: 30 capsule; Refill: 6 - Ambulatory referral to Psychology  3. Anxiety I will renew his referral. I did recommend he return to consistent use of the Paxil so we can assess how it is working for his anxiety.  - Ambulatory referral to Psychology  4. Drug-induced erectile dysfunction Recommend we trial sildenafil for his ED.  - sildenafil (REVATIO) 20 MG tablet; Take 1-2 tablets 30 minutes prior to intercourse. No more than 2 in 24 hours.  Dispense: 20 tablet; Refill: 0  Loyola Mast, MD

## 2021-05-25 ENCOUNTER — Ambulatory Visit: Payer: BC Managed Care – PPO | Admitting: Family Medicine

## 2021-06-15 ENCOUNTER — Ambulatory Visit: Payer: BC Managed Care – PPO | Admitting: Family Medicine

## 2021-06-24 ENCOUNTER — Telehealth: Payer: BC Managed Care – PPO | Admitting: Nurse Practitioner

## 2021-06-24 DIAGNOSIS — R197 Diarrhea, unspecified: Secondary | ICD-10-CM

## 2021-06-24 DIAGNOSIS — R110 Nausea: Secondary | ICD-10-CM | POA: Diagnosis not present

## 2021-06-24 MED ORDER — LOPERAMIDE HCL 2 MG PO TABS: 2.0000 mg | ORAL_TABLET | Freq: Three times a day (TID) | ORAL | 0 refills | Status: AC | PRN

## 2021-06-24 MED ORDER — ONDANSETRON 4 MG PO TBDP
4.0000 mg | ORAL_TABLET | Freq: Three times a day (TID) | ORAL | 0 refills | Status: DC | PRN
Start: 2021-06-24 — End: 2022-02-16

## 2021-06-24 NOTE — Progress Notes (Signed)
Virtual Visit Consent   Juan Esparza, you are scheduled for a virtual visit with a [facility] provider today.     Just as with appointments in the office, your consent must be obtained to participate.  Your consent will be active for this visit and any virtual visit you may have with one of our providers in the next 365 days.     If you have a MyChart account, a copy of this consent can be sent to you electronically.  All virtual visits are billed to your insurance company just like a traditional visit in the office.    As this is a virtual visit, video technology does not allow for your provider to perform a traditional examination.  This may limit your provider's ability to fully assess your condition.  If your provider identifies any concerns that need to be evaluated in person or the need to arrange testing (such as labs, EKG, etc.), we will make arrangements to do so.     Although advances in technology are sophisticated, we cannot ensure that it will always work on either your end or our end.  If the connection with a video visit is poor, the visit may have to be switched to a telephone visit.  With either a video or telephone visit, we are not always able to ensure that we have a secure connection.     I need to obtain your verbal consent now.   Are you willing to proceed with your visit today?    Juan Esparza has provided verbal consent on 06/24/2021 for a virtual visit (video or telephone).   Claiborne Rigg, NP   Date: 06/24/2021 9:59 AM   Virtual Visit via Video Note   I, Claiborne Rigg, connected with  Juan Esparza  ([id number], 1980/11/13) on 06/24/21 at  9:45 AM EDT by a video-enabled telemedicine application and verified that I am speaking with the correct person using two identifiers.  Location: Patient: Virtual Visit Location Patient: Home Provider: Virtual Visit Location Provider: Home Office   I discussed the limitations of evaluation and management by  telemedicine and the availability of in person appointments. The patient expressed understanding and agreed to proceed.    History of Present Illness: Juan Esparza is a 40 y.o. who identifies as a male who was assigned male at birth, and is being seen today for Nausea, vomiting and diarrhea.  HPI:  Over the past 24 hours notes nausea, vomiting, and diarrhea. Believes it was something he ate last night but can not recall this morning. States emesis contained blood this morning the first time he vomited but no blood the second time. Notes 2 episodes of loose stools since symptoms began. Requesting work note for today.  Problems:  Patient Active Problem List   Diagnosis Date Noted   Chronic insomnia 12/25/2020   Anxiety 12/25/2020   Seasonal allergies 12/25/2020   Alcohol use disorder, mild, abuse 04/27/2020   GERD (gastroesophageal reflux disease) 11/11/2019   Diarrhea 11/11/2019    Allergies: No Known Allergies Medications:  Current Outpatient Medications:    ondansetron (ZOFRAN ODT) 4 MG disintegrating tablet, Take 1 tablet (4 mg total) by mouth every 8 (eight) hours as needed for nausea or vomiting., Disp: 20 tablet, Rfl: 0   acamprosate (CAMPRAL) 333 MG tablet, Take 2 tablets (666 mg total) by mouth 3 (three) times daily., Disp: 180 tablet, Rfl: 2   Cetirizine HCl 10 MG CAPS, Take 1 capsule (10 mg  total) by mouth daily., Disp: 30 capsule, Rfl: 2   famotidine (PEPCID) 40 MG tablet, TAKE 1 TABLET BY MOUTH AT BEDTIME, Disp: 30 tablet, Rfl: 0   hydrOXYzine (VISTARIL) 25 MG capsule, Take 1 capsule (25 mg total) by mouth at bedtime as needed., Disp: 30 capsule, Rfl: 6   ipratropium (ATROVENT) 0.06 % nasal spray, Place 2 sprays into both nostrils 4 (four) times daily., Disp: 15 mL, Rfl: 12   loperamide (IMODIUM A-D) 2 MG tablet, Take 1 tablet (2 mg total) by mouth 3 (three) times daily as needed for up to 7 days for diarrhea or loose stools., Disp: 21 tablet, Rfl: 0   omeprazole (PRILOSEC)  40 MG capsule, TAKE 2 CAPSULES BY MOUTH EVERY DAY, Disp: 60 capsule, Rfl: 0   PARoxetine (PAXIL) 20 MG tablet, Take 1 tablet (20 mg total) by mouth daily., Disp: 90 tablet, Rfl: 3   sildenafil (REVATIO) 20 MG tablet, Take 1-2 tablets 30 minutes prior to intercourse. No more than 2 in 24 hours., Disp: 20 tablet, Rfl: 0   traZODone (DESYREL) 50 MG tablet, Take 0.5-1 tablets (25-50 mg total) by mouth at bedtime as needed. for sleep, Disp: 30 tablet, Rfl: 6  Observations/Objective: Patient is well-developed, well-nourished in no acute distress.  Resting comfortably in bed at home.  Head is normocephalic, atraumatic.  No labored breathing.  Speech is clear and coherent with logical content.  Patient is alert and oriented at baseline.    Assessment and Plan: 1. Diarrhea, unspecified type - ondansetron (ZOFRAN ODT) 4 MG disintegrating tablet; Take 1 tablet (4 mg total) by mouth every 8 (eight) hours as needed for nausea or vomiting.  Dispense: 20 tablet; Refill: 0 - loperamide (IMODIUM A-D) 2 MG tablet; Take 1 tablet (2 mg total) by mouth 3 (three) times daily as needed for up to 7 days for diarrhea or loose stools.  Dispense: 21 tablet; Refill: 0  2. Nausea - ondansetron (ZOFRAN ODT) 4 MG disintegrating tablet; Take 1 tablet (4 mg total) by mouth every 8 (eight) hours as needed for nausea or vomiting.  Dispense: 20 tablet; Refill: 0 Patient instructed to follow up with his PCP regarding vomiting blood as this would be considered urgent.  It does not appear based on his profile that he is taking omeprazole and he has a history of ETOH abuse.   Follow Up Instructions: I discussed the assessment and treatment plan with the patient. The patient was provided an opportunity to ask questions and all were answered. The patient agreed with the plan and demonstrated an understanding of the instructions.  A copy of instructions were sent to the patient via MyChart unless otherwise noted below.    The  patient was advised to call back or seek an in-person evaluation if the symptoms worsen or if the condition fails to improve as anticipated.  Time:  I spent 10 minutes with the patient via telehealth technology discussing the above problems/concerns.    Claiborne Rigg, NP

## 2021-06-24 NOTE — Patient Instructions (Addendum)
We are sorry that you are not feeling well.  Here is how we plan to help!  Based on what you have shared with me it looks like you have Acute Infectious Diarrhea.  Most cases of acute diarrhea are due to infections with virus and bacteria and are self-limited conditions lasting less than 14 days.  For your symptoms you may take Imodium 2 mg tablets that are over the counter at your local pharmacy. Take two tablet now and then one after each loose stool up to 6 a day.  Antibiotics are not needed for most people with diarrhea.  Optional: Zofran 4 mg 1 tablet every 8 hours as needed for nausea and vomiting    HOME CARE We recommend changing your diet to help with your symptoms for the next few days. Drink plenty of fluids that contain water salt and sugar. Sports drinks such as Gatorade may help.  You may try broths, soups, bananas, applesauce, soft breads, mashed potatoes or crackers.  You are considered infectious for as long as the diarrhea continues. Hand washing or use of alcohol based hand sanitizers is recommend. It is best to stay out of work or school until your symptoms stop.   GET HELP RIGHT AWAY If you have dark yellow colored urine or do not pass urine frequently you should drink more fluids.   If your symptoms worsen  If you feel like you are going to pass out (faint) You have a new problem  MAKE SURE YOU  Understand these instructions. Will watch your condition. Will get help right away if you are not doing well or get worse.   Please review your pharmacy choice. Make sure the pharmacy is open so you can pick up prescription now. If there is a problem, you may contact your provider through [employer] and have the prescription routed to another pharmacy.  Your safety is important to [place]. If you have drug allergies check your prescription carefully.   For the next 24 hours you can use MyChart to ask questions about today's visit, request a non-urgent call back, or  ask for a work or school excuse. You will get an email in the next two days asking about your experience.

## 2021-07-14 ENCOUNTER — Telehealth: Payer: BC Managed Care – PPO | Admitting: Nurse Practitioner

## 2021-07-14 DIAGNOSIS — J111 Influenza due to unidentified influenza virus with other respiratory manifestations: Secondary | ICD-10-CM

## 2021-07-14 MED ORDER — OSELTAMIVIR PHOSPHATE 75 MG PO CAPS: 75.0000 mg | ORAL_CAPSULE | Freq: Two times a day (BID) | ORAL | 0 refills | Status: AC

## 2021-07-14 NOTE — Progress Notes (Signed)
Virtual Visit Consent   Juan Esparza, you are scheduled for a virtual visit with a [facility] provider today.     Just as with appointments in the office, your consent must be obtained to participate.  Your consent will be active for this visit and any virtual visit you may have with one of our providers in the next 365 days.     If you have a MyChart account, a copy of this consent can be sent to you electronically.  All virtual visits are billed to your insurance company just like a traditional visit in the office.    As this is a virtual visit, video technology does not allow for your provider to perform a traditional examination.  This may limit your provider's ability to fully assess your condition.  If your provider identifies any concerns that need to be evaluated in person or the need to arrange testing (such as labs, EKG, etc.), we will make arrangements to do so.     Although advances in technology are sophisticated, we cannot ensure that it will always work on either your end or our end.  If the connection with a video visit is poor, the visit may have to be switched to a telephone visit.  With either a video or telephone visit, we are not always able to ensure that we have a secure connection.     I need to obtain your verbal consent now.   Are you willing to proceed with your visit today?    Juan Esparza has provided verbal consent on 07/14/2021 for a virtual visit (video or telephone).   Viviano Simas, FNP   Date: 07/14/2021 10:06 AM   Virtual Visit via Video Note   I, Viviano Simas, connected with  Juan Esparza  ([id number], 1981-03-09) on 07/14/21 at 10:15 AM EST by a video-enabled telemedicine application and verified that I am speaking with the correct person using two identifiers.  Location: Patient: Virtual Visit Location Patient: Home Provider: Virtual Visit Location Provider: Home Office   I discussed the limitations of evaluation and management by  telemedicine and the availability of in person appointments. The patient expressed understanding and agreed to proceed.    History of Present Illness: Juan Esparza is a 40 y.o. who identifies as a male who was assigned male at birth, and is being seen today with complaints of flu like symptoms.  Patient started to have symptoms of cough, body aches and nasal congestion yesterday.   He was able to take a home COVID test and that was negative.  He has tried Benzonatate medication for relief.   He has also been taking some over the counter medications for relief and Vitamin C.   He denies a history of asthma   Wife was diagnosed with flu three days ago  Denies a history of bronchitis or pneumonia or needed to use an inhaler.   Problems:  Patient Active Problem List   Diagnosis Date Noted   Chronic insomnia 12/25/2020   Anxiety 12/25/2020   Seasonal allergies 12/25/2020   Alcohol use disorder, mild, abuse 04/27/2020   GERD (gastroesophageal reflux disease) 11/11/2019   Diarrhea 11/11/2019    Allergies: No Known Allergies Medications:  Current Outpatient Medications:    acamprosate (CAMPRAL) 333 MG tablet, Take 2 tablets (666 mg total) by mouth 3 (three) times daily., Disp: 180 tablet, Rfl: 2   Cetirizine HCl 10 MG CAPS, Take 1 capsule (10 mg total) by mouth daily., Disp:  30 capsule, Rfl: 2   famotidine (PEPCID) 40 MG tablet, TAKE 1 TABLET BY MOUTH AT BEDTIME, Disp: 30 tablet, Rfl: 0   hydrOXYzine (VISTARIL) 25 MG capsule, Take 1 capsule (25 mg total) by mouth at bedtime as needed., Disp: 30 capsule, Rfl: 6   ipratropium (ATROVENT) 0.06 % nasal spray, Place 2 sprays into both nostrils 4 (four) times daily., Disp: 15 mL, Rfl: 12   omeprazole (PRILOSEC) 40 MG capsule, TAKE 2 CAPSULES BY MOUTH EVERY DAY, Disp: 60 capsule, Rfl: 0   ondansetron (ZOFRAN ODT) 4 MG disintegrating tablet, Take 1 tablet (4 mg total) by mouth every 8 (eight) hours as needed for nausea or vomiting., Disp: 20  tablet, Rfl: 0   PARoxetine (PAXIL) 20 MG tablet, Take 1 tablet (20 mg total) by mouth daily., Disp: 90 tablet, Rfl: 3   sildenafil (REVATIO) 20 MG tablet, Take 1-2 tablets 30 minutes prior to intercourse. No more than 2 in 24 hours., Disp: 20 tablet, Rfl: 0   traZODone (DESYREL) 50 MG tablet, Take 0.5-1 tablets (25-50 mg total) by mouth at bedtime as needed. for sleep, Disp: 30 tablet, Rfl: 6  Observations/Objective: Patient is well-developed, well-nourished in no acute distress.  Resting comfortably at home.  Head is normocephalic, atraumatic.  No labored breathing.  Speech is clear and coherent with logical content.  Patient is alert and oriented at baseline.    Assessment and Plan: 1. Influenza  - oseltamivir (TAMIFLU) 75 MG capsule; Take 1 capsule (75 mg total) by mouth 2 (two) times daily for 5 days. Take with food  Dispense: 10 capsule; Refill: 0    Continue management of symptoms with OTC medications as dicussed. Mucinex for cough support. Tylenol for fever   Follow Up Instructions: I discussed the assessment and treatment plan with the patient. The patient was provided an opportunity to ask questions and all were answered. The patient agreed with the plan and demonstrated an understanding of the instructions.  A copy of instructions were sent to the patient via MyChart unless otherwise noted below.     The patient was advised to call back or seek an in-person evaluation if the symptoms worsen or if the condition fails to improve as anticipated.  Time:  I spent 10 minutes with the patient via telehealth technology discussing the above problems/concerns.    Viviano Simas, FNP

## 2021-07-17 ENCOUNTER — Other Ambulatory Visit: Payer: Self-pay | Admitting: Family Medicine

## 2021-07-17 DIAGNOSIS — F1010 Alcohol abuse, uncomplicated: Secondary | ICD-10-CM

## 2021-07-17 MED ORDER — ACAMPROSATE CALCIUM 333 MG PO TBEC
666.0000 mg | DELAYED_RELEASE_TABLET | Freq: Three times a day (TID) | ORAL | 2 refills | Status: DC
  Filled 2022-01-10: qty 180, 30d supply, fill #0

## 2021-07-17 NOTE — Addendum Note (Signed)
Addended by: Loyola Mast on: 07/17/2021 05:32 PM   Modules accepted: Orders

## 2021-08-04 ENCOUNTER — Telehealth: Payer: BC Managed Care – PPO | Admitting: Nurse Practitioner

## 2021-08-04 DIAGNOSIS — R053 Chronic cough: Secondary | ICD-10-CM

## 2021-08-04 MED ORDER — PREDNISONE 10 MG (21) PO TBPK: ORAL_TABLET | ORAL | 0 refills | Status: DC

## 2021-08-04 MED ORDER — ALBUTEROL SULFATE HFA 108 (90 BASE) MCG/ACT IN AERS: 2.0000 | INHALATION_SPRAY | Freq: Four times a day (QID) | RESPIRATORY_TRACT | 0 refills | Status: DC | PRN

## 2021-08-04 NOTE — Progress Notes (Signed)
Virtual Visit Consent   [name], you are scheduled for a virtual visit with Juan Daphine Deutscher, FNP, a [facility] provider, today.     Just as with appointments in the office, your consent must be obtained to participate.  Your consent will be active for this visit and any virtual visit you may have with one of our providers in the next 365 days.     If you have a MyChart account, a copy of this consent can be sent to you electronically.  All virtual visits are billed to your insurance company just like a traditional visit in the office.    As this is a virtual visit, video technology does not allow for your provider to perform a traditional examination.  This may limit your provider's ability to fully assess your condition.  If your provider identifies any concerns that need to be evaluated in person or the need to arrange testing (such as labs, EKG, etc.), we will make arrangements to do so.     Although advances in technology are sophisticated, we cannot ensure that it will always work on either your end or our end.  If the connection with a video visit is poor, the visit may have to be switched to a telephone visit.  With either a video or telephone visit, we are not always able to ensure that we have a secure connection.     I need to obtain your verbal consent now.   Are you willing to proceed with your visit today? YES   PATRIK TURNBAUGH has provided verbal consent on 08/04/2021 for a virtual visit (video or telephone).  Lost video and sound 1/2 way thorugh visit. Juan Daphine Deutscher, FNP   Date: 08/04/2021 8:42 AM   Virtual Visit via Video Note   I, Juan Esparza, connected with Juan Esparza ([id number], September 13, 1980) on 08/04/21 at  8:45 AM EST by a video-enabled telemedicine application and verified that I am speaking with the correct person using two identifiers.  Location: Patient: Virtual Visit Location Patient: Home Provider: Virtual Visit Location  Provider: Mobile   I discussed the limitations of evaluation and management by telemedicine and the availability of in person appointments. The patient expressed understanding and agreed to proceed.    History of Present Illness: Juan Esparza is a 40 y.o. who identifies as a male who was assigned male at birth, and is being seen today for cough.  HPI: Patient had the flu 2 weeks ago. He was treated with tamiflu. Got some better then he developed a cough. Cannot quit cough. He has tried mucinex which helps a lttle.   Review of Systems  Constitutional:  Negative for chills, fever and malaise/fatigue.  HENT:  Positive for congestion.   Respiratory:  Positive for cough. Negative for sputum production.   Musculoskeletal:  Negative for myalgias.  Neurological:  Negative for dizziness and headaches.   Problems:  Patient Active Problem List   Diagnosis Date Noted   Chronic insomnia 12/25/2020   Anxiety 12/25/2020   Seasonal allergies 12/25/2020   Alcohol use disorder, mild, abuse 04/27/2020   GERD (gastroesophageal reflux disease) 11/11/2019   Diarrhea 11/11/2019    Allergies: No Known Allergies Medications:  Current Outpatient Medications:    acamprosate (CAMPRAL) 333 MG tablet, Take 2 tablets (666 mg total) by mouth 3 (three) times daily., Disp: 180 tablet, Rfl: 2   Cetirizine HCl 10 MG CAPS, Take 1 capsule (10 mg total) by mouth daily., Disp: 30 capsule,  Rfl: 2   famotidine (PEPCID) 40 MG tablet, TAKE 1 TABLET BY MOUTH AT BEDTIME, Disp: 30 tablet, Rfl: 0   hydrOXYzine (VISTARIL) 25 MG capsule, Take 1 capsule (25 mg total) by mouth at bedtime as needed., Disp: 30 capsule, Rfl: 6   ipratropium (ATROVENT) 0.06 % nasal spray, Place 2 sprays into both nostrils 4 (four) times daily., Disp: 15 mL, Rfl: 12   omeprazole (PRILOSEC) 40 MG capsule, TAKE 2 CAPSULES BY MOUTH EVERY DAY, Disp: 60 capsule, Rfl: 0   ondansetron (ZOFRAN ODT) 4 MG disintegrating tablet, Take 1 tablet (4 mg total) by  mouth every 8 (eight) hours as needed for nausea or vomiting., Disp: 20 tablet, Rfl: 0   PARoxetine (PAXIL) 20 MG tablet, Take 1 tablet (20 mg total) by mouth daily., Disp: 90 tablet, Rfl: 3   sildenafil (REVATIO) 20 MG tablet, Take 1-2 tablets 30 minutes prior to intercourse. No more than 2 in 24 hours., Disp: 20 tablet, Rfl: 0   traZODone (DESYREL) 50 MG tablet, Take 0.5-1 tablets (25-50 mg total) by mouth at bedtime as needed. for sleep, Disp: 30 tablet, Rfl: 6  Observations/Objective: Patient is well-developed, well-nourished in no acute distress.  Resting comfortably  at home.  Head is normocephalic, atraumatic.  No labored breathing.  Speech is clear and coherent with logical content.  Patient is alert and oriented at baseline.  Voice hoarse Deep dry cough  Assessment and Plan:  [name] in today with chief complaint of No chief complaint on file.   1. Persistent cough Force fluids Rest  Run humidifier - predniSONE (STERAPRED UNI-PAK 21 TAB) 10 MG (21) TBPK tablet; As directed x 6 days  Dispense: 21 tablet; Refill: 0 - albuterol (VENTOLIN HFA) 108 (90 Base) MCG/ACT inhaler; Inhale 2 puffs into the lungs every 6 (six) hours as needed for wheezing or shortness of breath.  Dispense: 8 g; Refill: 0    Follow Up Instructions: I discussed the assessment and treatment plan with the patient. The patient was provided an opportunity to ask questions and all were answered. The patient agreed with the plan and demonstrated an understanding of the instructions.  A copy of instructions were sent to the patient via MyChart.  The patient was advised to call back or seek an in-person evaluation if the symptoms worsen or if the condition fails to improve as anticipated.  Time:  I spent 10 minutes with the patient via telehealth technology discussing the above problems/concerns.    Juan Daphine Deutscher, FNP

## 2021-08-04 NOTE — Patient Instructions (Signed)

## 2021-08-17 ENCOUNTER — Ambulatory Visit: Payer: BC Managed Care – PPO | Admitting: Family Medicine

## 2021-08-24 ENCOUNTER — Other Ambulatory Visit: Payer: Self-pay | Admitting: Family Medicine

## 2021-08-24 DIAGNOSIS — F5104 Psychophysiologic insomnia: Secondary | ICD-10-CM

## 2021-08-30 ENCOUNTER — Other Ambulatory Visit: Payer: Self-pay | Admitting: Family Medicine

## 2021-08-30 DIAGNOSIS — K219 Gastro-esophageal reflux disease without esophagitis: Secondary | ICD-10-CM

## 2021-09-10 ENCOUNTER — Encounter: Payer: Self-pay | Admitting: Family Medicine

## 2021-10-11 ENCOUNTER — Other Ambulatory Visit: Payer: Self-pay

## 2021-10-11 ENCOUNTER — Encounter (HOSPITAL_BASED_OUTPATIENT_CLINIC_OR_DEPARTMENT_OTHER): Payer: Self-pay

## 2021-10-11 ENCOUNTER — Emergency Department (HOSPITAL_BASED_OUTPATIENT_CLINIC_OR_DEPARTMENT_OTHER)
Admission: EM | Admit: 2021-10-11 | Discharge: 2021-10-11 | Disposition: A | Discharge status: Home / Self Care | Payer: BC Managed Care – PPO | Attending: Emergency Medicine | Admitting: Emergency Medicine

## 2021-10-11 DIAGNOSIS — Z79899 Other long term (current) drug therapy: Secondary | ICD-10-CM | POA: Diagnosis not present

## 2021-10-11 DIAGNOSIS — U071 COVID-19: Secondary | ICD-10-CM | POA: Diagnosis not present

## 2021-10-11 DIAGNOSIS — R059 Cough, unspecified: Secondary | ICD-10-CM | POA: Diagnosis not present

## 2021-10-11 LAB — RESP PANEL BY RT-PCR (FLU A&B, COVID) ARPGX2
Influenza A by PCR: NEGATIVE
Influenza B by PCR: NEGATIVE
SARS Coronavirus 2 by RT PCR: POSITIVE — AB

## 2021-10-11 NOTE — ED Provider Notes (Signed)
[facility] EMERGENCY DEPT Provider Note   CSN: [id number] Arrival date & time: 10/11/21  1442     History  Chief Complaint  Patient presents with   Cough    Juan Esparza is a 41 y.o. male.  The history is provided by the patient. No language interpreter was used.  Cough  41 year old male with significant history of GERD, alcohol use, anxiety, presents with COVID symptoms.  For the past 2 days patient has sore throat, mild headache, congestion, cough, body aches, and decrease in appetite.  Patient denies fever chills no nausea vomiting or diarrhea no shortness of breath.  No complaint of abdominal pain or dysuria.  He recently started a new job working for [employer].  No specific treatment tried at home  Home Medications Prior to Admission medications   Medication Sig Start Date End Date Taking? Authorizing Provider  acamprosate (CAMPRAL) 333 MG tablet Take 2 tablets (666 mg total) by mouth 3 (three) times daily. 07/17/21   Loyola Mast, MD  albuterol (VENTOLIN HFA) 108 (90 Base) MCG/ACT inhaler Inhale 2 puffs into the lungs every 6 (six) hours as needed for wheezing or shortness of breath. 08/04/21   Daphine Deutscher, Mary-Margaret, FNP  Cetirizine HCl 10 MG CAPS Take 1 capsule (10 mg total) by mouth daily. 12/25/20 03/26/21  Loyola Mast, MD  famotidine (PEPCID) 40 MG tablet TAKE 1 TABLET BY MOUTH AT BEDTIME 05/23/20   [name], MD  hydrOXYzine (VISTARIL) 25 MG capsule Take 1 capsule (25 mg total) by mouth at bedtime as needed. 03/26/21   Loyola Mast, MD  ipratropium (ATROVENT) 0.06 % nasal spray Place 2 sprays into both nostrils 4 (four) times daily. 12/25/20   Loyola Mast, MD  omeprazole (PRILOSEC) 40 MG capsule TAKE 2 CAPSULES BY MOUTH EVERY DAY 08/30/21   Loyola Mast, MD  ondansetron (ZOFRAN ODT) 4 MG disintegrating tablet Take 1 tablet (4 mg total) by mouth every 8 (eight) hours as needed for nausea or vomiting. 06/24/21   Claiborne Rigg, NP   PARoxetine (PAXIL) 20 MG tablet Take 1 tablet (20 mg total) by mouth daily. 01/22/21   Loyola Mast, MD  predniSONE (STERAPRED UNI-PAK 21 TAB) 10 MG (21) TBPK tablet As directed x 6 days 08/04/21   Daphine Deutscher, Mary-Margaret, FNP  sildenafil (REVATIO) 20 MG tablet Take 1-2 tablets 30 minutes prior to intercourse. No more than 2 in 24 hours. 03/26/21   Loyola Mast, MD  traZODone (DESYREL) 50 MG tablet TAKE 1/2 TO 1 TABLET BY MOUTH AT BEDTIME AS NEEDED FOR SLEEP 08/24/21   Loyola Mast, MD      Allergies    Patient has no known allergies.    Review of Systems   Review of Systems  Respiratory:  Positive for cough.   All other systems reviewed and are negative.  Physical Exam Updated Vital Signs BP (!) 140/94 (BP Location: Right Arm)    Pulse 82    Temp 98.1 F (36.7 C)    Resp 15    SpO2 100%  Physical Exam Vitals and nursing note reviewed.  Constitutional:      General: He is not in acute distress.    Appearance: He is well-developed.  HENT:     Head: Atraumatic.  Eyes:     Conjunctiva/sclera: Conjunctivae normal.  Cardiovascular:     Rate and Rhythm: Normal rate and regular rhythm.     Pulses: Normal pulses.     Heart  sounds: Normal heart sounds.  Pulmonary:     Effort: Pulmonary effort is normal.     Breath sounds: Normal breath sounds. No wheezing, rhonchi or rales.  Musculoskeletal:     Cervical back: Neck supple.  Skin:    Findings: No rash.  Neurological:     Mental Status: He is alert.    ED Results / Procedures / Treatments   Labs (all labs ordered are listed, but only abnormal results are displayed) Labs Reviewed  RESP PANEL BY RT-PCR (FLU A&B, COVID) ARPGX2 - Abnormal; Notable for the following components:      Result Value   SARS Coronavirus 2 by RT PCR POSITIVE (*)    All other components within normal limits    EKG None  Radiology No results found.  Procedures Procedures    Medications Ordered in ED Medications - No data to display  ED  Course/ Medical Decision Making/ A&P                           Medical Decision Making  BP (!) 138/110    Pulse 75    Temp 98.1 F (36.7 C)    Resp 15    SpO2 100%   5:53 PM Patient here with COVID symptoms for the past 2 days.  He is overall well-appearing lungs clear on auscultation.  He is afebrile, vital signs stable, no hypoxia.  Viral respiratory panel obtained and interpreted by me and is positive for COVID infection.  Patient made aware of findings.  I have consider chest x-ray to rule out pneumonia but patient is not hypoxic and lungs are clear.  Will provide work note and instruction for home care.  Return precaution given.  Juan Esparza was evaluated in Emergency Department on 10/11/2021 for the symptoms described in the history of present illness. He was evaluated in the context of the global COVID-19 pandemic, which necessitated consideration that the patient might be at risk for infection with the SARS-CoV-2 virus that causes COVID-19. Institutional protocols and algorithms that pertain to the evaluation of patients at risk for COVID-19 are in a state of rapid change based on information released by regulatory bodies including the CDC and federal and state organizations. These policies and algorithms were followed during the patient's care in the ED.         Final Clinical Impression(s) / ED Diagnoses Final diagnoses:  None    Rx / DC Orders ED Discharge Orders     None         Fayrene Helper, PA-C 10/11/21 1800    Benjiman Core, MD 10/12/21 0028

## 2021-10-11 NOTE — ED Triage Notes (Signed)
Pt presents with runny nose, chest congestion and cough x3-4 days. Talking Tylenol w/no relief, denies taking any decongestant meds.

## 2021-10-11 NOTE — Discharge Instructions (Signed)
You have tested positive for covid infection.  Please follow instruction below  Recommendations for at home COVID-19 symptoms management:  Please continue isolation at home. You may return to work after 10 days since the onset of your sickness and no fever for at least 24 hours If have acute worsening of symptoms please go to ER/urgent care for further evaluation. Check pulse oximetry and if below 90-92% please go to ER. The following supplements MAY help:  Vitamin C 500mg  twice a day and Quercetin 250-500 mg twice a day Vitamin D3 2000 - 4000 u/day B Complex vitamins Zinc 75-100 mg/day Melatonin 6-10 mg at night (the optimal dose is unknown) Aspirin 81mg /day (if no history of bleeding issues)

## 2021-10-11 NOTE — ED Notes (Signed)
Dc instructions reviewed with pt no questions or concerns at this time.  

## 2022-01-09 ENCOUNTER — Other Ambulatory Visit (HOSPITAL_COMMUNITY): Payer: Self-pay

## 2022-01-10 ENCOUNTER — Other Ambulatory Visit (HOSPITAL_COMMUNITY): Payer: Self-pay

## 2022-01-10 MED FILL — Trazodone HCl Tab 50 MG: ORAL | 30 days supply | Qty: 30 | Fill #0 | Status: CN

## 2022-01-10 MED FILL — Paroxetine HCl Tab 20 MG: ORAL | 90 days supply | Qty: 90 | Fill #0 | Status: CN

## 2022-01-12 ENCOUNTER — Encounter: Payer: Self-pay | Admitting: Family Medicine

## 2022-01-13 ENCOUNTER — Other Ambulatory Visit: Payer: Self-pay | Admitting: Family Medicine

## 2022-01-13 DIAGNOSIS — K219 Gastro-esophageal reflux disease without esophagitis: Secondary | ICD-10-CM

## 2022-01-13 DIAGNOSIS — F5104 Psychophysiologic insomnia: Secondary | ICD-10-CM

## 2022-01-13 DIAGNOSIS — F1010 Alcohol abuse, uncomplicated: Secondary | ICD-10-CM

## 2022-01-14 ENCOUNTER — Other Ambulatory Visit (HOSPITAL_COMMUNITY): Payer: Self-pay

## 2022-01-14 ENCOUNTER — Other Ambulatory Visit: Payer: Self-pay

## 2022-01-14 ENCOUNTER — Other Ambulatory Visit: Payer: Self-pay | Admitting: Family Medicine

## 2022-01-14 DIAGNOSIS — K219 Gastro-esophageal reflux disease without esophagitis: Secondary | ICD-10-CM

## 2022-01-14 MED ORDER — OMEPRAZOLE 40 MG PO CPDR: 80.0000 mg | DELAYED_RELEASE_CAPSULE | Freq: Every day | ORAL | 0 refills | Status: DC

## 2022-01-15 ENCOUNTER — Other Ambulatory Visit: Payer: Self-pay | Admitting: Family Medicine

## 2022-01-15 ENCOUNTER — Other Ambulatory Visit (HOSPITAL_COMMUNITY): Payer: Self-pay

## 2022-01-15 DIAGNOSIS — K219 Gastro-esophageal reflux disease without esophagitis: Secondary | ICD-10-CM

## 2022-01-16 ENCOUNTER — Other Ambulatory Visit (HOSPITAL_COMMUNITY): Payer: Self-pay

## 2022-01-18 ENCOUNTER — Other Ambulatory Visit (HOSPITAL_COMMUNITY): Payer: Self-pay

## 2022-02-07 ENCOUNTER — Other Ambulatory Visit (HOSPITAL_COMMUNITY): Payer: Self-pay

## 2022-02-07 ENCOUNTER — Other Ambulatory Visit: Payer: Self-pay | Admitting: Family Medicine

## 2022-02-07 DIAGNOSIS — F419 Anxiety disorder, unspecified: Secondary | ICD-10-CM

## 2022-02-07 MED ORDER — PAROXETINE HCL 20 MG PO TABS
20.0000 mg | ORAL_TABLET | Freq: Every day | ORAL | 0 refills | Status: DC
  Filled 2022-02-07: qty 30, 30d supply, fill #0

## 2022-02-07 MED FILL — Acamprosate Calcium Tab Delayed Release 333 MG: ORAL | 30 days supply | Qty: 180 | Fill #0 | Status: CN

## 2022-02-07 MED FILL — Trazodone HCl Tab 50 MG: ORAL | 30 days supply | Qty: 30 | Fill #0 | Status: AC

## 2022-02-15 ENCOUNTER — Other Ambulatory Visit (HOSPITAL_COMMUNITY): Payer: Self-pay

## 2022-02-16 ENCOUNTER — Encounter: Payer: Self-pay | Admitting: Emergency Medicine

## 2022-02-16 ENCOUNTER — Ambulatory Visit
Admission: EM | Admit: 2022-02-16 | Discharge: 2022-02-16 | Disposition: A | Discharge status: Home / Self Care | Payer: 59 | Attending: Emergency Medicine | Admitting: Emergency Medicine

## 2022-02-16 DIAGNOSIS — J310 Chronic rhinitis: Secondary | ICD-10-CM

## 2022-02-16 DIAGNOSIS — H66001 Acute suppurative otitis media without spontaneous rupture of ear drum, right ear: Secondary | ICD-10-CM

## 2022-02-16 DIAGNOSIS — R0689 Other abnormalities of breathing: Secondary | ICD-10-CM

## 2022-02-16 DIAGNOSIS — J329 Chronic sinusitis, unspecified: Secondary | ICD-10-CM | POA: Diagnosis not present

## 2022-02-16 DIAGNOSIS — J069 Acute upper respiratory infection, unspecified: Secondary | ICD-10-CM | POA: Diagnosis not present

## 2022-02-16 MED ORDER — ALBUTEROL SULFATE HFA 108 (90 BASE) MCG/ACT IN AERS: 2.0000 | INHALATION_SPRAY | Freq: Four times a day (QID) | RESPIRATORY_TRACT | 0 refills | Status: DC | PRN

## 2022-02-16 MED ORDER — FLUTICASONE PROPIONATE 50 MCG/ACT NA SUSP: 1.0000 | Freq: Every day | NASAL | 2 refills | Status: DC

## 2022-02-16 MED ORDER — PROMETHAZINE-DM 6.25-15 MG/5ML PO SYRP: 5.0000 mL | ORAL_SOLUTION | Freq: Four times a day (QID) | ORAL | 0 refills | Status: DC | PRN

## 2022-02-16 MED ORDER — TRIAMCINOLONE ACETONIDE 40 MG/ML IJ SUSP
40.0000 mg | Freq: Once | INTRAMUSCULAR | Status: AC
  Administered 2022-02-16: 40 mg via INTRAMUSCULAR

## 2022-02-16 MED ORDER — PROMETHAZINE-DM 6.25-15 MG/5ML PO SYRP
5.0000 mL | ORAL_SOLUTION | Freq: Four times a day (QID) | ORAL | 0 refills | Status: DC | PRN
Start: 2022-02-16 — End: 2022-02-16

## 2022-02-16 MED ORDER — AZITHROMYCIN 250 MG PO TABS: ORAL_TABLET | ORAL | 0 refills | Status: DC

## 2022-02-16 MED ORDER — AZITHROMYCIN 250 MG PO TABS: ORAL_TABLET | ORAL | 0 refills | Status: AC

## 2022-02-16 NOTE — ED Provider Notes (Signed)
[facility]    CSN: [id number] Arrival date & time: 02/16/22  1231    HISTORY   Chief Complaint  Patient presents with   Nasal Congestion   Cough   Sore Throat   HPI Juan Esparza is a 41 y.o. male. Patient presents urgent care complaining of a 1 day history of dry, persistent cough worse at nighttime, runny nose, rhinorrhea and scratchy throat.  Patient denies fever, aches, chills, nausea, vomiting, diarrhea, otalgia, pleuritic chest pain, nausea, vomiting, diarrhea, headache, loss of taste or smell.  Patient has normal vital signs on arrival today.  EMR reviewed, patient has been prescribed albuterol in the past but denies history of allergies and asthma.  Patient states has not tried any medications for his symptoms as of yet.  The history is provided by the patient.   Past Medical History:  Diagnosis Date   GERD (gastroesophageal reflux disease)    Sleep apnea    Patient Active Problem List   Diagnosis Date Noted   Chronic insomnia 12/25/2020   Anxiety 12/25/2020   Seasonal allergies 12/25/2020   Alcohol use disorder, mild, abuse 04/27/2020   GERD (gastroesophageal reflux disease) 11/11/2019   Diarrhea 11/11/2019   History reviewed. No pertinent surgical history.  Home Medications    Prior to Admission medications   Medication Sig Start Date End Date Taking? Authorizing Provider  acamprosate (CAMPRAL) 333 MG tablet Take 2 tablets (666 mg total) by mouth 3 (three) times daily. 01/14/22   Loyola Mast, MD  albuterol (VENTOLIN HFA) 108 (90 Base) MCG/ACT inhaler Inhale 2 puffs into the lungs every 6 (six) hours as needed for wheezing or shortness of breath. 08/04/21   Daphine Deutscher Mary-Margaret, FNP  hydrOXYzine (VISTARIL) 25 MG capsule TAKE 1 CAPSULE (25 MG TOTAL) BY MOUTH AT BEDTIME AS NEEDED. 01/14/22   Loyola Mast, MD  omeprazole (PRILOSEC) 40 MG capsule TAKE 2 CAPSULES BY MOUTH EVERY DAY 01/14/22   Loyola Mast, MD  omeprazole (PRILOSEC) 40 MG capsule  Take 2 capsules (80 mg total) by mouth daily. 01/14/22   Loyola Mast, MD  PARoxetine (PAXIL) 20 MG tablet Take 1 tablet (20 mg total) by mouth daily. 02/07/22   Loyola Mast, MD  sildenafil (REVATIO) 20 MG tablet Take 1-2 tablets 30 minutes prior to intercourse. No more than 2 in 24 hours. 03/26/21   Loyola Mast, MD  traZODone (DESYREL) 50 MG tablet Take 0.5-1 tablets (25-50 mg total) by mouth at bedtime as needed for sleep 08/24/21   Loyola Mast, MD   Family History Family History  Problem Relation Age of Onset   Asthma Mother    Depression Mother    Stroke Mother    Alcohol abuse Father    Drug abuse Father    Asthma Sister    Social History Social History   Tobacco Use   Smoking status: Former    Years: 2.00    Types: Cigarettes   Smokeless tobacco: Never  Vaping Use   Vaping Use: Some days  Substance Use Topics   Alcohol use: Yes   Drug use: No   Allergies   Patient has no known allergies.  Review of Systems Review of Systems Pertinent findings noted in history of present illness.   Physical Exam Triage Vital Signs ED Triage Vitals  Enc Vitals Group     BP 06/29/21 0827 (!) 147/82     Pulse Rate 06/29/21 0827 72     Resp 06/29/21 0827  18     Temp 06/29/21 0827 98.3 F (36.8 C)     Temp Source 06/29/21 0827 Oral     SpO2 06/29/21 0827 98 %     Weight --      Height --      Head Circumference --      Peak Flow --      Pain Score 06/29/21 0826 5     Pain Loc --      Pain Edu? --      Excl. in GC? --   No data found.  Updated Vital Signs BP 121/89   Pulse 72   Temp 98.2 F (36.8 C)   Resp 18   SpO2 98%   Physical Exam Vitals and nursing note reviewed.  Constitutional:      General: He is not in acute distress.    Appearance: Normal appearance. He is not ill-appearing.  HENT:     Head: Normocephalic and atraumatic.     Salivary Glands: Right salivary gland is not diffusely enlarged or tender. Left salivary gland is not diffusely  enlarged or tender.     Right Ear: Hearing, ear canal and external ear normal. No drainage. No middle ear effusion. There is no impacted cerumen. Tympanic membrane is injected, erythematous and bulging (Purulent fluid).     Left Ear: Hearing, ear canal and external ear normal. No drainage.  No middle ear effusion. There is no impacted cerumen. Tympanic membrane is bulging (Clear fluid). Tympanic membrane is not injected or erythematous.     Nose: Rhinorrhea present. No nasal deformity, septal deviation, signs of injury, nasal tenderness, mucosal edema or congestion. Rhinorrhea is clear.     Right Nostril: Occlusion present. No foreign body, epistaxis or septal hematoma.     Left Nostril: Occlusion present. No foreign body, epistaxis or septal hematoma.     Right Turbinates: Enlarged, swollen and pale.     Left Turbinates: Enlarged, swollen and pale.     Right Sinus: No maxillary sinus tenderness or frontal sinus tenderness.     Left Sinus: No maxillary sinus tenderness or frontal sinus tenderness.     Mouth/Throat:     Lips: Pink. No lesions.     Mouth: Mucous membranes are moist. No oral lesions.     Pharynx: Oropharynx is clear. Uvula midline. No posterior oropharyngeal erythema or uvula swelling.     Tonsils: No tonsillar exudate. 0 on the right. 0 on the left.     Comments: Postnasal drip Eyes:     General: Lids are normal.        Right eye: No discharge.        Left eye: No discharge.     Extraocular Movements: Extraocular movements intact.     Conjunctiva/sclera: Conjunctivae normal.     Right eye: Right conjunctiva is not injected.     Left eye: Left conjunctiva is not injected.  Neck:     Trachea: Trachea and phonation normal.  Cardiovascular:     Rate and Rhythm: Normal rate and regular rhythm.     Pulses: Normal pulses.     Heart sounds: Normal heart sounds. No murmur heard.    No friction rub. No gallop.  Pulmonary:     Effort: Pulmonary effort is normal. No accessory  muscle usage, prolonged expiration or respiratory distress.     Breath sounds: No stridor, decreased air movement or transmitted upper airway sounds. Examination of the right-upper field reveals decreased breath sounds. Examination of the left-upper  field reveals decreased breath sounds. Examination of the right-middle field reveals decreased breath sounds. Examination of the left-middle field reveals decreased breath sounds. Examination of the right-lower field reveals decreased breath sounds. Examination of the left-lower field reveals decreased breath sounds. Decreased breath sounds present. No wheezing, rhonchi or rales.  Chest:     Chest wall: No tenderness.  Musculoskeletal:        General: Normal range of motion.     Cervical back: Full passive range of motion without pain, normal range of motion and neck supple. Normal range of motion.  Lymphadenopathy:     Cervical: Cervical adenopathy present.     Right cervical: Superficial cervical adenopathy present.     Left cervical: Superficial cervical adenopathy present.  Skin:    General: Skin is warm and dry.     Findings: No erythema or rash.  Neurological:     General: No focal deficit present.     Mental Status: He is alert and oriented to person, place, and time.  Psychiatric:        Mood and Affect: Mood normal.        Behavior: Behavior normal.     Visual Acuity Right Eye Distance:   Left Eye Distance:   Bilateral Distance:    Right Eye Near:   Left Eye Near:    Bilateral Near:     UC Couse / Diagnostics / Procedures:    EKG  Radiology No results found.  Procedures Procedures (including critical care time)  UC Diagnoses / Final Clinical Impressions(s)   I have reviewed the triage vital signs and the nursing notes.  Pertinent labs & imaging results that were available during my care of the patient were reviewed by me and considered in my medical decision making (see chart for details).   Final diagnoses:  Viral  upper respiratory tract infection with cough  Decreased breath sounds  Non-recurrent acute suppurative otitis media of right ear without spontaneous rupture of tympanic membrane  Rhinosinusitis   Patient advised that I recommend he resume albuterol and received an injection of Kenalog during his visit today to improve his breath sounds.  Patient provided with a prescription for azithromycin for bacterial infection in his right ear.  Patient advised to use Flonase for nasal congestion and was also provided with a prescription for Promethazine DM for nighttime cough to help him sleep.  Return precautions advised.  ED Prescriptions     Medication Sig Dispense Auth. Provider   fluticasone (FLONASE) 50 MCG/ACT nasal spray Place 1 spray into both nostrils daily. Begin by using 2 sprays in each nare daily for 3 to 5 days, then decrease to 1 spray in each nare daily. 15.8 mL Theadora RamaMorgan, Kaavya Puskarich Scales, PA-C   azithromycin (ZITHROMAX) 250 MG tablet Take 2 tablets (500 mg total) by mouth daily for 1 day, THEN 1 tablet (250 mg total) daily for 4 days. 6 tablet Theadora RamaMorgan, Shany Marinez Scales, PA-C   albuterol (VENTOLIN HFA) 108 (90 Base) MCG/ACT inhaler Inhale 2 puffs into the lungs every 6 (six) hours as needed for wheezing or shortness of breath (Cough). 18 g Theadora RamaMorgan, Delorse Shane Scales, PA-C   promethazine-dextromethorphan (PROMETHAZINE-DM) 6.25-15 MG/5ML syrup Take 5 mLs by mouth 4 (four) times daily as needed for cough. 180 mL Theadora RamaMorgan, Andriea Hasegawa Scales, PA-C      PDMP not reviewed this encounter.  Pending results:  Labs Reviewed - No data to display  Medications Ordered in UC: Medications  triamcinolone acetonide (KENALOG-40) injection 40 mg (has  no administration in time range)    Disposition Upon Discharge:  Condition: stable for discharge home Home: take medications as prescribed; routine discharge instructions as discussed; follow up as advised.  Patient presented with an acute illness with associated  systemic symptoms and significant discomfort requiring urgent management. In my opinion, this is a condition that a prudent lay person (someone who possesses an average knowledge of health and medicine) may potentially expect to result in complications if not addressed urgently such as respiratory distress, impairment of bodily function or dysfunction of bodily organs.   Routine symptom specific, illness specific and/or disease specific instructions were discussed with the patient and/or caregiver at length.   As such, the patient has been evaluated and assessed, work-up was performed and treatment was provided in alignment with urgent care protocols and evidence based medicine.  Patient/parent/caregiver has been advised that the patient may require follow up for further testing and treatment if the symptoms continue in spite of treatment, as clinically indicated and appropriate.  If the patient was tested for COVID-19, Influenza and/or RSV, then the patient/parent/guardian was advised to isolate at home pending the results of his/her diagnostic coronavirus test and potentially longer if they're positive. I have also advised pt that if his/her COVID-19 test returns positive, it's recommended to self-isolate for at least 10 days after symptoms first appeared AND until fever-free for 24 hours without fever reducer AND other symptoms have improved or resolved. Discussed self-isolation recommendations as well as instructions for household member/close contacts as per the [facility] and [place] [facility], and also gave patient the COVID packet with this information.  Patient/parent/caregiver has been advised to return to the [facility] or PCP in 3-5 days if no better; to PCP or the Emergency Department if new signs and symptoms develop, or if the current signs or symptoms continue to change or worsen for further workup, evaluation and treatment as clinically indicated and appropriate  The patient will follow up with their current PCP  if and as advised. If the patient does not currently have a PCP we will assist them in obtaining one.   The patient may need specialty follow up if the symptoms continue, in spite of conservative treatment and management, for further workup, evaluation, consultation and treatment as clinically indicated and appropriate.  Patient/parent/caregiver verbalized understanding and agreement of plan as discussed.  All questions were addressed during visit.  Please see discharge instructions below for further details of plan.  Discharge Instructions:   Discharge Instructions      Your symptoms and physical exam findings are concerning for a viral respiratory infection that has evolved into a bacterial infection in your right ear and significant inflammation in your lungs.  Please see the list below for recommended medications, dosages and frequencies to provide relief of your current symptoms:    Z-Pak (azithromycin):  take 2 tablets the first day, then take 1 tablet daily every day thereafter until complete.   Kenalog IM (triamcinolone):  To quickly address your significant respiratory inflammation, you were provided with an injection of Kenalog in the office today.  You should continue to feel the full benefit of the steroid for the next 24-36 hours.    Flonase (fluticasone): This is a steroid nasal spray that you use once daily, 1 spray in each nare.  This medication does not work well if you decide to use it only used as you feel you need to, it works best used on a daily basis.  After 3 to 5  days of use, you will notice significant reduction of the inflammation and mucus production that is currently being caused by exposure to allergens, whether seasonal or environmental.  The most common side effect of this medication is nosebleeds.  If you experience a nosebleed, please discontinue use for 1 week, then feel free to resume.  I have provided you with a prescription.     ProAir, Ventolin, Proventil  (albuterol): This inhaled medication contains a short acting beta agonist bronchodilator.  This medication works on the smooth muscle that opens and constricts of your airways by relaxing the muscle.  The result of relaxation of the smooth muscle is increased air movement and improved work of breathing.  This is a short acting medication that can be used every 4-6 hours as needed for increased work of breathing, shortness of breath, wheezing and excessive coughing.  I have provided you with a prescription.   Promethazine DM: Promethazine is both a nasal decongestant and an antinausea medication that makes most patients feel fairly sleepy.  The DM is dextromethorphan, a cough suppressant found in many over-the-counter cough medications.  Please take 5 mL before bedtime to minimize your cough which will help you sleep better.  I have sent a prescription for this medication to your pharmacy.   Please follow-up within the next 7-10 days either with your primary care provider or urgent care if your symptoms do not resolve.  If you do not have a primary care provider, we will assist you in finding one.   Thank you for visiting urgent care today.  We appreciate the opportunity to participate in your care.       This office note has been dictated using Teaching laboratory technician.  Unfortunately, and despite my best efforts, this method of dictation can sometimes lead to occasional typographical or grammatical errors.  I apologize in advance if this occurs.     Theadora Rama Scales, PA-C 02/16/22 1315

## 2022-02-16 NOTE — ED Triage Notes (Signed)
Pt here with cough and runny nose with a scratchy throat since yesterday.

## 2022-02-16 NOTE — Discharge Instructions (Signed)
Your symptoms and physical exam findings are concerning for a viral respiratory infection that has evolved into a bacterial infection in your right ear and significant inflammation in your lungs.  Please see the list below for recommended medications, dosages and frequencies to provide relief of your current symptoms:    Z-Pak (azithromycin):  take 2 tablets the first day, then take 1 tablet daily every day thereafter until complete.   Kenalog IM (triamcinolone):  To quickly address your significant respiratory inflammation, you were provided with an injection of Kenalog in the office today.  You should continue to feel the full benefit of the steroid for the next 24-36 hours.    Flonase (fluticasone): This is a steroid nasal spray that you use once daily, 1 spray in each nare.  This medication does not work well if you decide to use it only used as you feel you need to, it works best used on a daily basis.  After 3 to 5 days of use, you will notice significant reduction of the inflammation and mucus production that is currently being caused by exposure to allergens, whether seasonal or environmental.  The most common side effect of this medication is nosebleeds.  If you experience a nosebleed, please discontinue use for 1 week, then feel free to resume.  I have provided you with a prescription.     ProAir, Ventolin, Proventil (albuterol): This inhaled medication contains a short acting beta agonist bronchodilator.  This medication works on the smooth muscle that opens and constricts of your airways by relaxing the muscle.  The result of relaxation of the smooth muscle is increased air movement and improved work of breathing.  This is a short acting medication that can be used every 4-6 hours as needed for increased work of breathing, shortness of breath, wheezing and excessive coughing.  I have provided you with a prescription.   Promethazine DM: Promethazine is both a nasal decongestant and an  antinausea medication that makes most patients feel fairly sleepy.  The DM is dextromethorphan, a cough suppressant found in many over-the-counter cough medications.  Please take 5 mL before bedtime to minimize your cough which will help you sleep better.  I have sent a prescription for this medication to your pharmacy.   Please follow-up within the next 7-10 days either with your primary care provider or urgent care if your symptoms do not resolve.  If you do not have a primary care provider, we will assist you in finding one.   Thank you for visiting urgent care today.  We appreciate the opportunity to participate in your care.

## 2022-02-19 ENCOUNTER — Ambulatory Visit (INDEPENDENT_AMBULATORY_CARE_PROVIDER_SITE_OTHER): Payer: 59 | Admitting: Family Medicine

## 2022-02-19 ENCOUNTER — Encounter: Payer: Self-pay | Admitting: Family Medicine

## 2022-02-19 ENCOUNTER — Other Ambulatory Visit (HOSPITAL_COMMUNITY): Payer: Self-pay

## 2022-02-19 ENCOUNTER — Other Ambulatory Visit: Payer: Self-pay | Admitting: Family Medicine

## 2022-02-19 VITALS — BP 132/78 | HR 86 | Temp 97.6°F | Ht 66.4 in | Wt 158.4 lb

## 2022-02-19 DIAGNOSIS — F419 Anxiety disorder, unspecified: Secondary | ICD-10-CM

## 2022-02-19 DIAGNOSIS — F1010 Alcohol abuse, uncomplicated: Secondary | ICD-10-CM | POA: Diagnosis not present

## 2022-02-19 DIAGNOSIS — Z13220 Encounter for screening for lipoid disorders: Secondary | ICD-10-CM

## 2022-02-19 DIAGNOSIS — Z0000 Encounter for general adult medical examination without abnormal findings: Secondary | ICD-10-CM

## 2022-02-19 DIAGNOSIS — F5104 Psychophysiologic insomnia: Secondary | ICD-10-CM | POA: Diagnosis not present

## 2022-02-19 DIAGNOSIS — N522 Drug-induced erectile dysfunction: Secondary | ICD-10-CM

## 2022-02-19 LAB — LIPID PANEL
Cholesterol: 252 mg/dL — ABNORMAL HIGH (ref 0–200)
HDL: 72.5 mg/dL (ref >39.00)
LDL Cholesterol: 152 mg/dL — ABNORMAL HIGH (ref 0–99)
NonHDL: 179.82
Total CHOL/HDL Ratio: 3
Triglycerides: 140 mg/dL (ref 0.0–149.0)
VLDL: 28 mg/dL (ref 0.0–40.0)

## 2022-02-19 MED ORDER — HYDROXYZINE PAMOATE 25 MG PO CAPS
25.0000 mg | ORAL_CAPSULE | Freq: Every evening | ORAL | 6 refills | Status: DC | PRN
  Filled 2022-02-19: qty 30, 30d supply, fill #0

## 2022-02-19 MED ORDER — ACAMPROSATE CALCIUM 333 MG PO TBEC
666.0000 mg | DELAYED_RELEASE_TABLET | Freq: Three times a day (TID) | ORAL | 2 refills | Status: DC
  Filled 2022-02-19 – 2022-03-25 (×2): qty 180, 30d supply, fill #0

## 2022-02-19 MED ORDER — SILDENAFIL CITRATE 20 MG PO TABS
ORAL_TABLET | ORAL | 0 refills | Status: DC
  Filled 2022-02-19: qty 20, 10d supply, fill #0

## 2022-02-19 MED ORDER — TRAZODONE HCL 100 MG PO TABS
100.0000 mg | ORAL_TABLET | Freq: Every evening | ORAL | 3 refills | Status: DC | PRN
  Filled 2022-02-19 – 2022-03-04 (×2): qty 90, 90d supply, fill #0
  Filled 2022-06-03 – 2022-06-12 (×2): qty 90, 90d supply, fill #1

## 2022-02-19 NOTE — Progress Notes (Signed)
[facility] [address] [place] [address] Dept: [phone] Dept Fax: [phone]  Annual Physical Visit  Subjective:    Patient ID: Juan Esparza, male    DOB: Apr 30, 1981, 41 y.o..   MRN: [id number]  Chief Complaint  Patient presents with   Annual Exam    CPE/labs.  Fasting today.  ? Increase dose of Trazodone.    History of Present Illness:  Patient is in today for an annual physical/preventative visit.  Juan Esparza notes he is doing better overall. 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 He is now engaged and is planning a wedding in late July.  Review of Systems  Constitutional:  Negative for chills, fever, malaise/fatigue and weight loss.  HENT:  Negative for congestion, ear discharge, ear pain, hearing loss, sinus pain and sore throat.   Eyes:  Negative for blurred vision, pain, discharge and redness.  Respiratory:  Negative for cough, shortness of breath and wheezing.   Cardiovascular:  Negative for chest pain, palpitations and leg swelling.  Gastrointestinal:  Negative for abdominal pain, constipation, diarrhea, heartburn, nausea and vomiting.  Musculoskeletal:  Negative for back pain, joint pain and neck pain.  Skin:  Negative for rash.  Psychiatric/Behavioral:  The patient is nervous/anxious and has insomnia.        Anxiety is stable on paroxetine. He has increased his trazodone to 100 mg to help with sleep. History fo alcohol abuse. He is keeping this to use 1-2 times a month and feels better about this. He wants to continue on the acamprosate for this   Past Medical History: Patient Active Problem List   Diagnosis Date Noted   Chronic insomnia 12/25/2020   Anxiety 12/25/2020   Seasonal allergies 12/25/2020   Alcohol use disorder, mild, abuse 04/27/2020   GERD (gastroesophageal reflux  disease) 11/11/2019   Diarrhea 11/11/2019   No past surgical history on file.  Family History  Problem Relation Age of Onset   Asthma Mother    Depression Mother    Stroke Mother    Alcohol abuse Father    Drug abuse Father    Asthma Sister    Outpatient Medications Prior to Visit  Medication Sig Dispense Refill   acamprosate (CAMPRAL) 333 MG tablet Take 2 tablets (666 mg total) by mouth 3 (three) times daily. 180 tablet 2   albuterol (VENTOLIN HFA) 108 (90 Base) MCG/ACT inhaler Inhale 2 puffs into the lungs every 6 (six) hours as needed for wheezing or shortness of breath (Cough). 18 g 0   azithromycin (ZITHROMAX) 250 MG tablet Take 2 tablets (500 mg total) by mouth daily for 1 day, THEN 1 tablet (250 mg total) daily for 4 days. 6 tablet 0   fluticasone (FLONASE) 50 MCG/ACT nasal spray Place 1 spray into both nostrils daily. Begin by using 2 sprays in each nare daily for 3 to 5 days, then decrease to 1 spray in each nare daily. 15.8 mL 2   hydrOXYzine (VISTARIL) 25 MG capsule TAKE 1 CAPSULE (25 MG TOTAL) BY MOUTH AT BEDTIME AS NEEDED. 30 capsule 6   omeprazole (PRILOSEC) 40 MG capsule TAKE 2 CAPSULES BY MOUTH EVERY DAY 180 capsule 3   PARoxetine (PAXIL) 20 MG tablet Take 1 tablet (20 mg total) by mouth daily. 30 tablet 0   promethazine-dextromethorphan (PROMETHAZINE-DM) 6.25-15 MG/5ML syrup Take 5 mLs by mouth  4 (four) times daily as needed for cough. 180 mL 0   sildenafil (REVATIO) 20 MG tablet Take 1-2 tablets 30 minutes prior to intercourse. No more than 2 in 24 hours. 20 tablet 0   traZODone (DESYREL) 50 MG tablet Take 0.5-1 tablets (25-50 mg total) by mouth at bedtime as needed for sleep 30 tablet 6   omeprazole (PRILOSEC) 40 MG capsule Take 2 capsules (80 mg total) by mouth daily. 60 capsule 0   No facility-administered medications prior to visit.   No Known Allergies    Objective:   Today's Vitals   02/19/22 1353  BP: 132/78  Pulse: 86  Temp: 97.6 F (36.4 C)  TempSrc:  Temporal  SpO2: 99%  Weight: 158 lb 6.4 oz (71.8 kg)  Height: 5' 6.4" (1.687 m)   Body mass index is 25.26 kg/m.   General: Well developed, well nourished. No acute distress. HEENT: Normocephalic, non-traumatic. PERRL, EOMI. Conjunctiva clear. External ears normal.   EAC and TMs normal bilaterally. Nose clear without congestion or rhinorrhea. Mucous membranes   moist. Oropharynx clear. Good dentition. Neck: Supple. No lymphadenopathy. No thyromegaly. Lungs: Clear to auscultation bilaterally. No wheezing, rales or rhonchi. CV: RRR without murmurs or rubs. Pulses 2+ bilaterally. Abdomen: Soft, non-tender. Bowel sounds positive, normal pitch and frequency. No   hepatosplenomegaly. No rebound or guarding. Extremities: Full ROM. No joint swelling or tenderness. No edema noted. Skin: Warm and dry. No rashes. Psych: Alert and oriented. Normal mood and affect.  There are no preventive care reminders to display for this patient.    Assessment & Plan:  1. Annual physical exam Overall good health. Due for lipid screening. We discussed other routine screenings, many of which are not due until he is older. Up to date on immunizations.  2. Alcohol use disorder, mild, abuse Alcohol use is limited. I will continue the Campral, as he continues to hope to achieve complete sobriety. [name] feels this has helped.  - acamprosate (CAMPRAL) 333 MG tablet; Take 2 tablets (666 mg total) by mouth 3 (three) times daily.  Dispense: 180 tablet; Refill: 2  3. Chronic insomnia I am okay with his increase in trazodone to 100 mg, so we will cotninue this dose.  - traZODone (DESYREL) 100 MG tablet; Take 1 tablet (100 mg total) by mouth at bedtime as needed.  Dispense: 90 tablet; Refill: 3  4. Anxiety Improved on paroxetine 20 mg daily.  5. Drug-induced erectile dysfunction Noting good effect with use of 1-2 tabs.  - sildenafil (REVATIO) 20 MG tablet; Take 1-2 tablets 30 minutes prior to intercourse. No  more than 2 in 24 hours.  Dispense: 20 tablet; Refill: 0  6. Screening for lipid disorders  - Lipid panel  Return in about 6 months (around 08/21/2022) for Reassessment.   Loyola Mast, MD

## 2022-02-20 ENCOUNTER — Other Ambulatory Visit (HOSPITAL_COMMUNITY): Payer: Self-pay

## 2022-02-22 ENCOUNTER — Telehealth: Payer: Self-pay

## 2022-02-22 NOTE — Telephone Encounter (Signed)
Pafor Sildenafil 20 mg  submitted VIA phone to [employer] @ [phone] spoke to [place].  [place] received determination in 24/72 hours.    Dm/cma

## 2022-02-27 NOTE — Telephone Encounter (Signed)
PA denied per [facility] insurance on 02/26/22.   Lft VM to rtn call.  Dm/cma

## 2022-02-28 ENCOUNTER — Other Ambulatory Visit (HOSPITAL_COMMUNITY): Payer: Self-pay

## 2022-02-28 NOTE — Telephone Encounter (Signed)
Lft VM to rtn call. Dm/cma  

## 2022-03-04 ENCOUNTER — Other Ambulatory Visit (HOSPITAL_COMMUNITY): Payer: Self-pay

## 2022-03-14 ENCOUNTER — Ambulatory Visit: Payer: 59 | Admitting: Family Medicine

## 2022-03-14 ENCOUNTER — Telehealth: Payer: Self-pay | Admitting: Family Medicine

## 2022-03-14 NOTE — Telephone Encounter (Signed)
Pt was a no show 03/14/2022 for an OV with Dr. Veto Kemps, I have sent out a no show letter

## 2022-03-25 ENCOUNTER — Ambulatory Visit
Admission: EM | Admit: 2022-03-25 | Discharge: 2022-03-25 | Disposition: A | Discharge status: Home / Self Care | Payer: 59

## 2022-03-25 ENCOUNTER — Other Ambulatory Visit (HOSPITAL_COMMUNITY): Payer: Self-pay

## 2022-03-25 DIAGNOSIS — Z20822 Contact with and (suspected) exposure to covid-19: Secondary | ICD-10-CM

## 2022-03-25 DIAGNOSIS — B349 Viral infection, unspecified: Secondary | ICD-10-CM

## 2022-03-25 MED FILL — Omeprazole Cap Delayed Release 40 MG: ORAL | 90 days supply | Qty: 180 | Fill #0 | Status: AC

## 2022-03-25 NOTE — ED Provider Notes (Signed)
Patient is a [employer], broadcasting/film/video, was advised to go to health at work to get tested for COVID-19.   Theadora Rama Scales, PA-C 03/25/22 1355

## 2022-03-25 NOTE — ED Triage Notes (Signed)
Pt states Saturday he began having abd pain, vomiting, chills, fever and generalized body aches.  Home interventions: cold/ flu cough medication, tylenol

## 2022-04-05 ENCOUNTER — Encounter: Payer: Self-pay | Admitting: Family Medicine

## 2022-04-05 NOTE — Telephone Encounter (Signed)
Understood, fee generated, final warning letter sent

## 2022-04-05 NOTE — Telephone Encounter (Signed)
Missed appointments: 11/20/2021 same day cancellation 02/12/21 no show 05/25/21 no show 06/15/21 no show 08/17/21 no show 03/14/22 no show  Please advise if you would like me to generate dismissal letter and cancel future appointments.

## 2022-06-03 ENCOUNTER — Other Ambulatory Visit (HOSPITAL_COMMUNITY): Payer: Self-pay

## 2022-06-11 ENCOUNTER — Other Ambulatory Visit (HOSPITAL_COMMUNITY): Payer: Self-pay

## 2022-06-12 ENCOUNTER — Other Ambulatory Visit (HOSPITAL_COMMUNITY): Payer: Self-pay

## 2022-06-17 ENCOUNTER — Ambulatory Visit
Admission: EM | Admit: 2022-06-17 | Discharge: 2022-06-17 | Disposition: A | Discharge status: Home / Self Care | Payer: 59 | Attending: Emergency Medicine | Admitting: Emergency Medicine

## 2022-06-17 ENCOUNTER — Other Ambulatory Visit (HOSPITAL_COMMUNITY): Payer: Self-pay

## 2022-06-17 DIAGNOSIS — Z1152 Encounter for screening for COVID-19: Secondary | ICD-10-CM | POA: Diagnosis not present

## 2022-06-17 DIAGNOSIS — B349 Viral infection, unspecified: Secondary | ICD-10-CM | POA: Diagnosis not present

## 2022-06-17 DIAGNOSIS — J029 Acute pharyngitis, unspecified: Secondary | ICD-10-CM | POA: Insufficient documentation

## 2022-06-17 DIAGNOSIS — B309 Viral conjunctivitis, unspecified: Secondary | ICD-10-CM | POA: Diagnosis present

## 2022-06-17 LAB — RESP PANEL BY RT-PCR (FLU A&B, COVID) ARPGX2
Influenza A by PCR: NEGATIVE
Influenza B by PCR: NEGATIVE
SARS Coronavirus 2 by RT PCR: NEGATIVE

## 2022-06-17 LAB — POCT RAPID STREP A (OFFICE): Rapid Strep A Screen: NEGATIVE

## 2022-06-17 MED ORDER — OLOPATADINE HCL 0.2 % OP SOLN
1.0000 [drp] | Freq: Every day | OPHTHALMIC | 1 refills | Status: DC
  Filled 2022-06-17: qty 2.5, 25d supply, fill #0

## 2022-06-17 NOTE — ED Provider Notes (Signed)
[facility]    CSN: [id number] Arrival date & time: 06/17/22  1253    HISTORY   Chief Complaint  Patient presents with   Conjunctivitis   Diarrhea   Sore Throat   Headache   HPI Juan Esparza is a pleasant, 41 y.o. male who presents to urgent care today. Patient complains of a 1 day history of sore throat, mild, generalized headache,  chills and body aches as well as itching, drainage and redness of right eye.  Sent episode of diarrhea this morning.  States he has not tried any remedies to alleviate his symptoms.  States he was feeling well until yesterday evening.  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  States everyone at home as well.  The history is provided by the patient.  Conjunctivitis This is a new problem. The current episode started 12 to 24 hours ago. The problem has not changed since onset.Associated symptoms include headaches. Pertinent negatives include no chest pain, no abdominal pain and no shortness of breath. Nothing aggravates the symptoms. He has tried nothing for the symptoms.  Diarrhea Quality:  Watery Severity:  Mild Onset quality:  Sudden Number of episodes:  1 Duration:  6 hours Progression:  Unchanged Relieved by:  None tried Worsened by:  Nothing Ineffective treatments:  None tried Associated symptoms: chills, headaches and myalgias   Associated symptoms: no abdominal pain and no vomiting   Headaches:    Severity:  Mild Risk factors: no recent antibiotic use, no sick contacts, no suspicious food intake and no travel to endemic areas   Sore Throat This is a new problem. The current episode started yesterday. The problem occurs constantly. The problem has not changed since onset.Associated symptoms include headaches. Pertinent negatives include no chest pain, no abdominal pain and no shortness of breath. The symptoms are aggravated by swallowing. He has tried nothing for the symptoms.  Headache Associated symptoms:  diarrhea and myalgias   Associated symptoms: no abdominal pain and no vomiting    Past Medical History:  Diagnosis Date   GERD (gastroesophageal reflux disease)    Sleep apnea    Patient Active Problem List   Diagnosis Date Noted   Chronic insomnia 12/25/2020   Anxiety 12/25/2020   Seasonal allergies 12/25/2020   Alcohol use disorder, mild, abuse 04/27/2020   GERD (gastroesophageal reflux disease) 11/11/2019   Diarrhea 11/11/2019   History reviewed. No pertinent surgical history.  Home Medications    Prior to Admission medications   Medication Sig Start Date End Date Taking? Authorizing Provider  acamprosate (CAMPRAL) 333 MG tablet Take 2 tablets (666 mg total) by mouth 3 (three) times daily. 02/19/22   Loyola Mast, MD  albuterol (VENTOLIN HFA) 108 (90 Base) MCG/ACT inhaler Inhale 2 puffs into the lungs every 6 (six) hours as needed for wheezing or shortness of breath (Cough). 02/16/22   Theadora Rama Scales, PA-C  fluticasone (FLONASE) 50 MCG/ACT nasal spray Place 1 spray into both nostrils daily. Begin by using 2 sprays in each nare daily for 3 to 5 days, then decrease to 1 spray in each nare daily. 02/16/22   Theadora Rama Scales, PA-C  hydrOXYzine (VISTARIL) 25 MG capsule Take 1 capsule (25 mg total) by mouth at bedtime as needed. 02/19/22   Loyola Mast, MD  omeprazole (PRILOSEC) 40 MG capsule Take 2 capsules (80 mg total) by mouth daily. 01/14/22   Loyola Mast, MD  PARoxetine (PAXIL) 20 MG tablet Take 1 tablet (  20 mg total) by mouth daily. 02/07/22   Haydee Salter, MD  promethazine-dextromethorphan (PROMETHAZINE-DM) 6.25-15 MG/5ML syrup Take 5 mLs by mouth 4 (four) times daily as needed for cough. 02/16/22   Lynden Oxford Scales, PA-C  sildenafil (REVATIO) 20 MG tablet Take 1-2 tablets by mouth 30 minutes prior to intercourse. No more than 2 tablets in 24 hours. 02/19/22   Haydee Salter, MD  traZODone (DESYREL) 100 MG tablet Take 1 tablet (100 mg total) by mouth at  bedtime as needed. 02/19/22   Haydee Salter, MD    Family History Family History  Problem Relation Age of Onset   Asthma Mother    Depression Mother    Stroke Mother    Alcohol abuse Father    Drug abuse Father    Asthma Sister    Social History Social History   Tobacco Use   Smoking status: Former    Packs/day: 0.50    Years: 2.00    Total pack years: 1.00    Types: Cigarettes    Quit date: 2021    Years since quitting: 2.7   Smokeless tobacco: Never  Vaping Use   Vaping Use: Some days  Substance Use Topics   Alcohol use: Yes    Comment: socially   Drug use: No   Allergies   Patient has no known allergies.  Review of Systems Review of Systems  Constitutional:  Positive for chills.  Respiratory:  Negative for shortness of breath.   Cardiovascular:  Negative for chest pain.  Gastrointestinal:  Positive for diarrhea. Negative for abdominal pain and vomiting.  Musculoskeletal:  Positive for myalgias.  Neurological:  Positive for headaches.   Pertinent findings revealed after performing a 14 point review of systems has been noted in the history of present illness.  Physical Exam Triage Vital Signs ED Triage Vitals  Enc Vitals Group     BP 06/29/21 0827 (!) 147/82     Pulse Rate 06/29/21 0827 72     Resp 06/29/21 0827 18     Temp 06/29/21 0827 98.3 F (36.8 C)     Temp Source 06/29/21 0827 Oral     SpO2 06/29/21 0827 98 %     Weight --      Height --      Head Circumference --      Peak Flow --      Pain Score 06/29/21 0826 5     Pain Loc --      Pain Edu? --      Excl. in [place]? --   No data found.  Updated Vital Signs BP 125/84 (BP Location: Left Arm)   Pulse 92   Temp 98.3 F (36.8 C) (Oral)   Resp 16   SpO2 98%   Physical Exam  Visual Acuity Right Eye Distance:   Left Eye Distance:   Bilateral Distance:    Right Eye Near:   Left Eye Near:    Bilateral Near:     UC Couse / Diagnostics / Procedures:     Radiology No results  found.  Procedures Procedures (including critical care time) EKG  Pending results:  Labs Reviewed  CULTURE, GROUP A STREP ([place])  RESP PANEL BY RT-PCR (FLU A&B, COVID) [id number]  POCT RAPID STREP A (OFFICE)    Medications Ordered in UC: Medications - No data to display  UC Diagnoses / Final Clinical Impressions(s)   I have reviewed the triage vital signs and the nursing notes.  Pertinent labs &  imaging results that were available during my care of the patient were reviewed by me and considered in my medical decision making (see chart for details).    Final diagnoses:  Viral illness  Acute pharyngitis, unspecified etiology   ***  ED Prescriptions   None    PDMP not reviewed this encounter.  Disposition Upon Discharge:  Condition: stable for discharge home Home: take medications as prescribed; routine discharge instructions as discussed; follow up as advised.  Patient presented with an acute illness with associated systemic symptoms and significant discomfort requiring urgent management. In my opinion, this is a condition that a prudent lay person (someone who possesses an average knowledge of health and medicine) may potentially expect to result in complications if not addressed urgently such as respiratory distress, impairment of bodily function or dysfunction of bodily organs.   Routine symptom specific, illness specific and/or disease specific instructions were discussed with the patient and/or caregiver at length.   As such, the patient has been evaluated and assessed, work-up was performed and treatment was provided in alignment with urgent care protocols and evidence based medicine.  Patient/parent/caregiver has been advised that the patient may require follow up for further testing and treatment if the symptoms continue in spite of treatment, as clinically indicated and appropriate.  If the patient was tested for COVID-19, Influenza and/or RSV, then the  patient/parent/guardian was advised to isolate at home pending the results of his/her diagnostic coronavirus test and potentially longer if they're positive. I have also advised pt that if his/her COVID-19 test returns positive, it's recommended to self-isolate for at least 10 days after symptoms first appeared AND until fever-free for 24 hours without fever reducer AND other symptoms have improved or resolved. Discussed self-isolation recommendations as well as instructions for household member/close contacts as per the [facility] and [place] DHHS, and also gave patient the COVID packet with this information.  Patient/parent/caregiver has been advised to return to the [facility] or PCP in 3-5 days if no better; to PCP or the Emergency Department if new signs and symptoms develop, or if the current signs or symptoms continue to change or worsen for further workup, evaluation and treatment as clinically indicated and appropriate  The patient will follow up with their current PCP if and as advised. If the patient does not currently have a PCP we will assist them in obtaining one.   The patient may need specialty follow up if the symptoms continue, in spite of conservative treatment and management, for further workup, evaluation, consultation and treatment as clinically indicated and appropriate.  Patient/parent/caregiver verbalized understanding and agreement of plan as discussed.  All questions were addressed during visit.  Please see discharge instructions below for further details of plan.  Discharge Instructions: Discharge Instructions   None     This office note has been dictated using Dragon speech recognition software.  Unfortunately, this method of dictation can sometimes lead to typographical or grammatical errors.  I apologize for your inconvenience in advance if this occurs.  Please do not hesitate to reach out to me if clarification is needed.

## 2022-06-17 NOTE — Discharge Instructions (Addendum)
Your strep test today is negative.  Streptococcal throat culture will be performed per our protocol.  The result of your throat culture will be posted to your MyChart once it is complete, this typically takes 3 to 5 days.  If your streptococcal throat culture is positive, you will be contacted by phone and antibiotics will prescribed for you.   You received a COVID-19 and influenza PCR today.  The results of your PCR testing will be posted to your MyChart once it is complete.  This typically takes 6 to 12 hours.    If your COVID-19 PCR test is positive, you will be contacted by phone.  Because you do not have a history of being immune compromised, you are currently vaccinated for COVID-19, you are under the age of 13, you do not have a risk of severe disease due to COVID-19, antiviral treatment is not indicated.     If your influenza PCR test is positive, you will be contacted by phone.  I recommend that you complete full 5-day course of Tamiflu.     If both your COVID-19 and influenza PCR tests are negative, then you can safely assume that your illness is due to one of the many less serious illnesses circulating in our community right now.  Conservative care is recommended with rest, drinking plenty of clear fluids, eating only when hungry, taking supportive medications for your symptoms and avoiding being around other people.  Please remain at home until you are fever free for 24 hours without the use of antifever medications such as Tylenol and ibuprofen.     Based on my physical exam findings and the history you have provided  today, I do not recommend antibiotics at this time.  I do not believe the risks and side effects of antibiotics would outweigh any minimal benefit that they might provide.         Please see the list below for recommended medications, dosages and frequencies to provide relief of current symptoms:     Zofran (ondansetron): This is a good antinausea medication that you can take  every 8 hours as needed for symptoms of nausea and vomiting.  It is a dissolvable tablet that you do not need to take with water, just put it under your tongue and let it melt away.  I have sent a prescription to your pharmacy.   Pataday (olopatadine): This is an antihistamine eyedrop that can be used once daily to help relieve dry eyes, itchy eyes and red eyes.  This antihistamine drop not only works for allergic conjunctivitis but is also very helpful with viral conjunctivitis.  Please do not use this drop more than once a day, for best relief please use this in the morning.    Imodium (loperamide): This is a good antidiarrheal medication that you can take after every episode of diarrhea to help slow your bowels and reduce the volume as well as the frequency of diarrhea.  You take this medication up to 4 times daily.   Please follow-up within the next 5-7 days either with your primary care provider or urgent care if your symptoms do not resolve.  If you do not have a primary care provider, we will assist you in finding one.        Thank you for visiting urgent care today.  We appreciate the opportunity to participate in your care.    [facility] for [facility] employee COVID-19 testing can be reached at [phone].

## 2022-06-17 NOTE — ED Triage Notes (Signed)
Pt c/o sore throat, headache, diarrhea, eye redness/ drainge/ and itchiness to right eye.   Started: last night   Home interventions:  none

## 2022-06-20 LAB — CULTURE, GROUP A STREP (THRC)

## 2022-08-17 ENCOUNTER — Telehealth: Payer: 59 | Admitting: Physician Assistant

## 2022-08-17 DIAGNOSIS — R6889 Other general symptoms and signs: Secondary | ICD-10-CM | POA: Diagnosis not present

## 2022-08-17 NOTE — Progress Notes (Signed)
Virtual Visit Consent   Juan Esparza, you are scheduled for a virtual visit with a [facility] provider today. Just as with appointments in the office, your consent must be obtained to participate. Your consent will be active for this visit and any virtual visit you may have with one of our providers in the next 365 days. If you have a MyChart account, a copy of this consent can be sent to you electronically.  As this is a virtual visit, video technology does not allow for your provider to perform a traditional examination. This may limit your provider's ability to fully assess your condition. If your provider identifies any concerns that need to be evaluated in person or the need to arrange testing (such as labs, EKG, etc.), we will make arrangements to do so. Although advances in technology are sophisticated, we cannot ensure that it will always work on either your end or our end. If the connection with a video visit is poor, the visit may have to be switched to a telephone visit. With either a video or telephone visit, we are not always able to ensure that we have a secure connection.  By engaging in this virtual visit, you consent to the provision of healthcare and authorize for your insurance to be billed (if applicable) for the services provided during this visit. Depending on your insurance coverage, you may receive a charge related to this service.  I need to obtain your verbal consent now. Are you willing to proceed with your visit today? Juan Esparza has provided verbal consent on 08/17/2022 for a virtual visit (video or telephone). Jarold Motto, [place]  Date: 08/17/2022 10:59 AM  Virtual Visit via Video Note   I, Jarold Motto, connected with  Juan Esparza  ([id number], [birth year]) on 08/17/22 at 10:45 AM EST by a video-enabled telemedicine application and verified that I am speaking with the correct person using two identifiers.  Location: Patient: Virtual Visit Location  Patient: Home Provider: Virtual Visit Location Provider: Home Office   I discussed the limitations of evaluation and management by telemedicine and the availability of in person appointments. The patient expressed understanding and agreed to proceed.    History of Present Illness: Juan Esparza is a 41 y.o. who identifies as a male who was assigned male at birth, and is being seen today for URI.  Yesterday started having cough, congestion, body aches, low grade fever and diarrhea. Denies known sick contacts. Works at [employer].  Normal temperature right now. Drinking fluids.  Taking tylenol  Denies: severe abdominal pain, vomiting, chest pain, SOB, wheezing, lightheadedness, dizziness  HPI: HPI  Problems:  Patient Active Problem List   Diagnosis Date Noted   Chronic insomnia 12/25/2020   Anxiety 12/25/2020   Seasonal allergies 12/25/2020   Alcohol use disorder, mild, abuse 04/27/2020   GERD (gastroesophageal reflux disease) 11/11/2019   Diarrhea 11/11/2019    Allergies: No Known Allergies Medications:  Current Outpatient Medications:    acamprosate (CAMPRAL) 333 MG tablet, Take 2 tablets (666 mg total) by mouth 3 (three) times daily., Disp: 180 tablet, Rfl: 2   albuterol (VENTOLIN HFA) 108 (90 Base) MCG/ACT inhaler, Inhale 2 puffs into the lungs every 6 (six) hours as needed for wheezing or shortness of breath (Cough)., Disp: 18 g, Rfl: 0   fluticasone (FLONASE) 50 MCG/ACT nasal spray, Place 1 spray into both nostrils daily. Begin by using 2 sprays in each nare daily for 3 to 5 days, then decrease  to 1 spray in each nare daily., Disp: 15.8 mL, Rfl: 2   hydrOXYzine (VISTARIL) 25 MG capsule, Take 1 capsule (25 mg total) by mouth at bedtime as needed., Disp: 30 capsule, Rfl: 6   Olopatadine HCl (PATADAY) 0.2 % SOLN, Apply 1 drop to eye daily., Disp: 2.5 mL, Rfl: 1   omeprazole (PRILOSEC) 40 MG capsule, Take 2 capsules (80 mg total) by mouth daily., Disp: 180 capsule,  Rfl: 3   PARoxetine (PAXIL) 20 MG tablet, Take 1 tablet (20 mg total) by mouth daily., Disp: 30 tablet, Rfl: 0   sildenafil (REVATIO) 20 MG tablet, Take 1-2 tablets by mouth 30 minutes prior to intercourse. No more than 2 tablets in 24 hours., Disp: 20 tablet, Rfl: 0   traZODone (DESYREL) 100 MG tablet, Take 1 tablet (100 mg total) by mouth at bedtime as needed., Disp: 90 tablet, Rfl: 3  Observations/Objective: Patient is well-developed, well-nourished in no acute distress.  Resting comfortably  at home.  Head is normocephalic, atraumatic.  No labored breathing.  Speech is clear and coherent with logical content.  Patient is alert and oriented at baseline.   Assessment and Plan: 1. Flu-like symptoms No red flags on exam/discussion. Work note provided. Recommend supportive care including tylenol. Do not feel abx are warranted at this time. Discussed taking medications as prescribed. Reviewed return precautions including worsening fever, SOB, worsening cough or other concerns. Push fluids and rest. I recommend that patient follow-up if symptoms worsen or persist despite treatment x 7-10 days, sooner if needed.   Follow Up Instructions: I discussed the assessment and treatment plan with the patient. The patient was provided an opportunity to ask questions and all were answered. The patient agreed with the plan and demonstrated an understanding of the instructions.  A copy of instructions were sent to the patient via MyChart unless otherwise noted below.   The patient was advised to call back or seek an in-person evaluation if the symptoms worsen or if the condition fails to improve as anticipated.  Time:  I spent 5-10 minutes with the patient via telehealth technology discussing the above problems/concerns.    Jarold Motto, [place]

## 2022-08-19 ENCOUNTER — Encounter: Payer: Self-pay | Admitting: Family Medicine

## 2022-08-19 ENCOUNTER — Telehealth: Payer: Self-pay | Admitting: Family Medicine

## 2022-08-19 ENCOUNTER — Telehealth: Payer: 59 | Admitting: Family Medicine

## 2022-08-19 DIAGNOSIS — R6889 Other general symptoms and signs: Secondary | ICD-10-CM

## 2022-08-19 NOTE — Telephone Encounter (Signed)
Called him to advised not sure who called him and advised on when his appointment is on 08/21/22. Dm/cma

## 2022-08-19 NOTE — Progress Notes (Signed)
[place]   Needs to follow up in person for on going flu like symptoms and need for work note extension.   Patient acknowledged agreement and understanding of the plan.

## 2022-08-19 NOTE — Telephone Encounter (Signed)
Pt said you called him but he missed your call and can you please call him again

## 2022-08-21 ENCOUNTER — Ambulatory Visit: Payer: Self-pay | Admitting: Family Medicine

## 2022-08-21 ENCOUNTER — Other Ambulatory Visit (HOSPITAL_COMMUNITY): Payer: Self-pay

## 2022-08-21 ENCOUNTER — Encounter: Payer: Self-pay | Admitting: Family Medicine

## 2022-08-21 VITALS — BP 122/80 | HR 81 | Temp 98.1°F | Ht 66.5 in | Wt 154.4 lb

## 2022-08-21 DIAGNOSIS — F39 Unspecified mood [affective] disorder: Secondary | ICD-10-CM

## 2022-08-21 DIAGNOSIS — F5104 Psychophysiologic insomnia: Secondary | ICD-10-CM

## 2022-08-21 DIAGNOSIS — F1010 Alcohol abuse, uncomplicated: Secondary | ICD-10-CM

## 2022-08-21 DIAGNOSIS — J069 Acute upper respiratory infection, unspecified: Secondary | ICD-10-CM

## 2022-08-21 DIAGNOSIS — K219 Gastro-esophageal reflux disease without esophagitis: Secondary | ICD-10-CM

## 2022-08-21 MED ORDER — OMEPRAZOLE 40 MG PO CPDR
80.0000 mg | DELAYED_RELEASE_CAPSULE | Freq: Every day | ORAL | 3 refills | Status: DC
  Filled 2022-08-21 – 2022-09-05 (×2): qty 180, 90d supply, fill #0
  Filled 2023-02-20: qty 180, 90d supply, fill #1

## 2022-08-21 MED ORDER — TRAZODONE HCL 100 MG PO TABS
100.0000 mg | ORAL_TABLET | Freq: Every evening | ORAL | 3 refills | Status: DC | PRN
  Filled 2022-08-21 – 2022-09-05 (×3): qty 90, 90d supply, fill #0

## 2022-08-21 MED ORDER — ACAMPROSATE CALCIUM 333 MG PO TBEC
666.0000 mg | DELAYED_RELEASE_TABLET | Freq: Three times a day (TID) | ORAL | 2 refills | Status: DC
  Filled 2022-08-21 – 2022-09-18 (×3): qty 180, 30d supply, fill #0

## 2022-08-21 NOTE — Progress Notes (Signed)
[facility] [address] [place] [address] Dept: [phone] Dept Fax: [phone]  Office Visit  Subjective:    Patient ID: Juan Esparza, male    DOB: 09-15-1980, 41 y.o..   MRN: [id number]  Chief Complaint  Patient presents with   Follow-up    F/u meds. Has been out of work since Friday with Flu like symptoms.         History of Present Illness:  Patient is in today for follow-up form a recent flu-like illness. Mr. Prosise was seen for a virtual visit on 08/17/2022. He had a 1-day history of congestion, body aches, low grade fever and diarrhea. Ms. Bufford Buttner gave him general advice regarding home care of viral illness. He did take Mon. and Tuesday off work. He notes he is not scheduled to return to work until Friday. He is noting improvements in his symptoms.  Mr. Rio has had a history of alcohol abuse. His prior physician had treated with with Campral. He has not been on this more recently. He notes that he is drinking two tall White Claws each evening. His wife, Shaune Pascal', who is with him today, notes concerns that he may be bipolar or have some other mood disorder. She notes that he yells a lot, esp. in the evenings. She feels he is manic at times. He has had prior episodes of depression. He had been previously identified as having anxiety and been started on Paxil. He has not continued this, as he feels it makes him zombie-like. Mr. Wicke has a history of chronic insomnia. He is managed on trazodone 100 mg qhs. He notes he still has issues with early mornign awakening and difficulty turning off worries.  Past Medical History: Patient Active Problem List   Diagnosis Date Noted   Chronic insomnia 12/25/2020   Anxiety 12/25/2020   Seasonal allergies 12/25/2020   Alcohol use disorder, mild, abuse 04/27/2020   GERD (gastroesophageal reflux disease) 11/11/2019   Diarrhea 11/11/2019   History reviewed. No pertinent  surgical history.  Family History  Problem Relation Age of Onset   Asthma Mother    Depression Mother    Stroke Mother    Alcohol abuse Father    Drug abuse Father    Asthma Sister    Outpatient Medications Prior to Visit  Medication Sig Dispense Refill   albuterol (VENTOLIN HFA) 108 (90 Base) MCG/ACT inhaler Inhale 2 puffs into the lungs every 6 (six) hours as needed for wheezing or shortness of breath (Cough). 18 g 0   fluticasone (FLONASE) 50 MCG/ACT nasal spray Place 1 spray into both nostrils daily. Begin by using 2 sprays in each nare daily for 3 to 5 days, then decrease to 1 spray in each nare daily. 15.8 mL 2   hydrOXYzine (VISTARIL) 25 MG capsule Take 1 capsule (25 mg total) by mouth at bedtime as needed. 30 capsule 6   Olopatadine HCl (PATADAY) 0.2 % SOLN Apply 1 drop to eye daily. 2.5 mL 1   PARoxetine (PAXIL) 20 MG tablet Take 1 tablet (20 mg total) by mouth daily. 30 tablet 0   sildenafil (REVATIO) 20 MG tablet Take 1-2 tablets by mouth 30 minutes prior to intercourse. No more than 2 tablets in 24 hours. 20 tablet 0   acamprosate (CAMPRAL) 333 MG tablet Take 2 tablets (666 mg total) by mouth 3 (three) times daily. 180 tablet 2   omeprazole (PRILOSEC) 40 MG capsule Take 2 capsules (80 mg total) by  mouth daily. 180 capsule 3   traZODone (DESYREL) 100 MG tablet Take 1 tablet (100 mg total) by mouth at bedtime as needed. 90 tablet 3   No facility-administered medications prior to visit.   No Known Allergies    Objective:   Today's Vitals   08/21/22 1358  BP: 122/80  Pulse: 81  Temp: 98.1 F (36.7 C)  TempSrc: Temporal  SpO2: 98%  Weight: 154 lb 6.4 oz (70 kg)  Height: 5' 6.5" (1.689 m)   Body mass index is 24.55 kg/m.   General: Well developed, well nourished. No acute distress. HEENT: Normocephalic, non-traumatic. Conjunctiva clear. External ears normal. EAC and TMs normal   bilaterally. Nose clear without congestion or rhinorrhea. Mucous membranes moist.  Oropharynx clear.   Good dentition. Neck: Supple. No lymphadenopathy. No thyromegaly. Lungs: Clear to auscultation bilaterally. No wheezing, rales or rhonchi. CV: RRR without murmurs or rubs. Pulses 2+ bilaterally. Psych: Alert and oriented. Normal mood and affect.  There are no preventive care reminders to display for this patient.  Mood Disorder Questionnaire Q1: 5 positive responses Q2: Yes Q3: Monor problem Q4: No Q5: No Impression: Negative (Form reviewed and will be scanned into the record)    Assessment & Plan:   1. Viral URI Discussed home care for viral illness, including rest, pushing fluids, and OTC medications as needed for symptom relief. Recommend hot tea with honey for sore throat symptoms. Follow-up if needed for worsening or persistent symptoms. May return to work on Friday.  2. Alcohol use disorder, mild, abuse Advise alcohol abstinence. I will restart him on Camprall. We discussed approach to tapering current alcohol use. He would be at low risk for significant withdrawal.  - acamprosate (CAMPRAL) 333 MG tablet; Take 2 tablets (666 mg total) by mouth 3 (three) times daily.  Dispense: 180 tablet; Refill: 2  3. Gastroesophageal reflux disease without esophagitis Stable. Continue Prilosec.  - omeprazole (PRILOSEC) 40 MG capsule; Take 2 capsules (80 mg total) by mouth daily.  Dispense: 180 capsule; Refill: 3  4. Chronic insomnia We will continue trazodone. We did discuss that CBT could help improve sleep long term.  - traZODone (DESYREL) 100 MG tablet; Take 1 tablet (100 mg total) by mouth at bedtime as needed.  Dispense: 90 tablet; Refill: 3 - Ambulatory referral to Psychology  5. Mood disorder (HCC) I do not believe Mr. Crossen has bipolar disorder. However, he does appear to have some mood disorder, possible a mix of anxiety and depression. It appears mild overall. I will leave him off of Paxil and refer him for counseling.  - Ambulatory referral to  Psychology   Return in about 6 months (around 02/20/2023).   Loyola Mast, MD

## 2022-08-22 ENCOUNTER — Other Ambulatory Visit: Payer: Self-pay

## 2022-08-23 ENCOUNTER — Other Ambulatory Visit: Payer: Self-pay

## 2022-08-23 ENCOUNTER — Other Ambulatory Visit (HOSPITAL_COMMUNITY): Payer: Self-pay

## 2022-08-29 ENCOUNTER — Other Ambulatory Visit (HOSPITAL_COMMUNITY): Payer: Self-pay

## 2022-09-05 ENCOUNTER — Other Ambulatory Visit (HOSPITAL_COMMUNITY): Payer: Self-pay

## 2022-09-06 ENCOUNTER — Other Ambulatory Visit (HOSPITAL_COMMUNITY): Payer: Self-pay

## 2022-09-18 ENCOUNTER — Other Ambulatory Visit (HOSPITAL_COMMUNITY): Payer: Self-pay

## 2022-10-06 ENCOUNTER — Ambulatory Visit (INDEPENDENT_AMBULATORY_CARE_PROVIDER_SITE_OTHER): Payer: Commercial Managed Care - PPO

## 2022-10-06 ENCOUNTER — Ambulatory Visit
Admission: EM | Admit: 2022-10-06 | Discharge: 2022-10-06 | Disposition: A | Discharge status: Home / Self Care | Payer: Self-pay | Attending: Urgent Care | Admitting: Urgent Care

## 2022-10-06 DIAGNOSIS — R079 Chest pain, unspecified: Secondary | ICD-10-CM

## 2022-10-06 DIAGNOSIS — R0789 Other chest pain: Secondary | ICD-10-CM

## 2022-10-06 MED ORDER — TIZANIDINE HCL 4 MG PO TABS
4.0000 mg | ORAL_TABLET | Freq: Every day | ORAL | 0 refills | Status: AC
  Filled 2022-10-06: qty 30, 30d supply, fill #0

## 2022-10-06 MED ORDER — NAPROXEN 375 MG PO TABS
375.0000 mg | ORAL_TABLET | Freq: Two times a day (BID) | ORAL | 0 refills | Status: DC
  Filled 2022-10-06: qty 30, 15d supply, fill #0

## 2022-10-06 NOTE — ED Provider Notes (Signed)
[facility]  Note:  This document was prepared using Systems analyst and may include unintentional dictation errors.  MRN: [id number] DOB: 07/28/1981  Subjective:   Juan Esparza is a 42 y.o. male presenting for 2-day history of persistent left lower anterior chest wall pain.  Patient states that he was in bed, started waking up with stretching away from the left side when he heard a pop.  Has since had persistent intermittent sharp pains to the area.  No fever, nausea, vomiting, cough, shortness of breath, abdominal pain.  No current facility-administered medications for this encounter.  Current Outpatient Medications:    acamprosate (CAMPRAL) 333 MG tablet, Take 2 tablets (666 mg total) by mouth 3 (three) times daily., Disp: 180 tablet, Rfl: 2   albuterol (VENTOLIN HFA) 108 (90 Base) MCG/ACT inhaler, Inhale 2 puffs into the lungs every 6 (six) hours as needed for wheezing or shortness of breath (Cough)., Disp: 18 g, Rfl: 0   fluticasone (FLONASE) 50 MCG/ACT nasal spray, Place 1 spray into both nostrils daily. Begin by using 2 sprays in each nare daily for 3 to 5 days, then decrease to 1 spray in each nare daily., Disp: 15.8 mL, Rfl: 2   hydrOXYzine (VISTARIL) 25 MG capsule, Take 1 capsule (25 mg total) by mouth at bedtime as needed., Disp: 30 capsule, Rfl: 6   Olopatadine HCl (PATADAY) 0.2 % SOLN, Apply 1 drop to eye daily., Disp: 2.5 mL, Rfl: 1   omeprazole (PRILOSEC) 40 MG capsule, Take 2 capsules (80 mg total) by mouth daily., Disp: 180 capsule, Rfl: 3   sildenafil (REVATIO) 20 MG tablet, Take 1-2 tablets by mouth 30 minutes prior to intercourse. No more than 2 tablets in 24 hours., Disp: 20 tablet, Rfl: 0   traZODone (DESYREL) 100 MG tablet, Take 1 tablet (100 mg total) by mouth at bedtime as needed., Disp: 90 tablet, Rfl: 3   No Known Allergies  Past Medical History:  Diagnosis Date   GERD (gastroesophageal reflux disease)    Sleep apnea       History reviewed. No pertinent surgical history.  Family History  Problem Relation Age of Onset   Asthma Mother    Depression Mother    Stroke Mother    Alcohol abuse Father    Drug abuse Father    Asthma Sister     Social History   Tobacco Use   Smoking status: Former    Packs/day: 0.50    Years: 2.00    Total pack years: 1.00    Types: Cigarettes    Quit date: 2021    Years since quitting: 3.0   Smokeless tobacco: Never  Vaping Use   Vaping Use: Some days  Substance Use Topics   Alcohol use: Yes    Comment: socially   Drug use: No    ROS   Objective:   Vitals: BP 124/89 (BP Location: Right Arm)   Pulse 90   Temp 98.6 F (37 C) (Oral)   Resp 16   SpO2 96%   Physical Exam Constitutional:      General: He is not in acute distress.    Appearance: Normal appearance. He is well-developed and normal weight. He is not ill-appearing, toxic-appearing or diaphoretic.  HENT:     Head: Normocephalic and atraumatic.     Right Ear: External ear normal.     Left Ear: External ear normal.     Nose: Nose normal.     Mouth/Throat:  Mouth: Mucous membranes are moist.  Eyes:     General: No scleral icterus.       Right eye: No discharge.        Left eye: No discharge.     Extraocular Movements: Extraocular movements intact.  Cardiovascular:     Rate and Rhythm: Normal rate and regular rhythm.     Heart sounds: Normal heart sounds. No murmur heard.    No friction rub. No gallop.  Pulmonary:     Effort: Pulmonary effort is normal. No respiratory distress.     Breath sounds: Normal breath sounds. No stridor. No wheezing, rhonchi or rales.  Chest:     Chest wall: Tenderness present.    Abdominal:     General: Bowel sounds are normal. There is no distension.     Palpations: Abdomen is soft. There is no mass.     Tenderness: There is no abdominal tenderness. There is no right CVA tenderness, left CVA tenderness, guarding or rebound.  Musculoskeletal:      Cervical back: Normal range of motion.  Neurological:     Mental Status: He is alert and oriented to person, place, and time.  Psychiatric:        Mood and Affect: Mood normal.        Behavior: Behavior normal.        Thought Content: Thought content normal.        Judgment: Judgment normal.    Assessment and Plan :   PDMP not reviewed this encounter.  1. Left-sided chest wall pain     X-ray over-read was pending at time of discharge, recommended follow up with only abnormal results. Otherwise will not call for negative over-read. Patient was in agreement. Low suspicion for hernia.  Recommended conservative management for musculoskeletal chest wall pain using naproxen and tizanidine.  Will update treatment plan and diagnosis as necessary following the x-ray overread. Counseled patient on potential for adverse effects with medications prescribed/recommended today, ER and return-to-clinic precautions discussed, patient verbalized understanding.    Jaynee Eagles, [place] 10/06/22 1421

## 2022-10-06 NOTE — ED Triage Notes (Signed)
Pt states he was stretching 2 days ago "heard a pop and a sharp pain" to left abd-NAD-steady gait

## 2022-10-06 NOTE — Discharge Instructions (Signed)
I will update you with your x-ray report by phone. Take naproxen with food for pain and inflammation. Use tizanidine at bedtime for muscle relaxant properties.

## 2022-10-07 ENCOUNTER — Other Ambulatory Visit (HOSPITAL_COMMUNITY): Payer: Self-pay

## 2022-10-07 ENCOUNTER — Other Ambulatory Visit: Payer: Self-pay

## 2022-10-21 ENCOUNTER — Other Ambulatory Visit (HOSPITAL_COMMUNITY): Payer: Self-pay

## 2023-02-11 ENCOUNTER — Ambulatory Visit
Admission: EM | Admit: 2023-02-11 | Discharge: 2023-02-11 | Disposition: A | Discharge status: Home / Self Care | Payer: Self-pay | Attending: Nurse Practitioner | Admitting: Nurse Practitioner

## 2023-02-11 DIAGNOSIS — H66002 Acute suppurative otitis media without spontaneous rupture of ear drum, left ear: Secondary | ICD-10-CM

## 2023-02-11 DIAGNOSIS — J029 Acute pharyngitis, unspecified: Secondary | ICD-10-CM

## 2023-02-11 LAB — POCT RAPID STREP A (OFFICE): Rapid Strep A Screen: NEGATIVE

## 2023-02-11 MED ORDER — AMOXICILLIN 875 MG PO TABS: 875.0000 mg | ORAL_TABLET | Freq: Two times a day (BID) | ORAL | 0 refills | Status: AC

## 2023-02-11 NOTE — ED Provider Notes (Signed)
[facility]    CSN: [id number] Arrival date & time: 02/11/23  0805      History   Chief Complaint Chief Complaint  Patient presents with   Sore Throat    HPI Juan Esparza is a 42 y.o. male  presents for evaluation of URI symptoms for 2 days. Patient reports associated symptoms of sore throat, left ear pain, runny nose, body aches. Denies N/V/D, fevers, cough, shortness of breath. Patient does not have a hx of asthma or smoking.  Wife had similar symptoms recently.  Pt has taken Tylenol OTC for symptoms. Pt has no other concerns at this time.    Sore Throat    Past Medical History:  Diagnosis Date   GERD (gastroesophageal reflux disease)    Sleep apnea     Patient Active Problem List   Diagnosis Date Noted   Chronic insomnia 12/25/2020   Anxiety 12/25/2020   Seasonal allergies 12/25/2020   Alcohol use disorder, mild, abuse 04/27/2020   GERD (gastroesophageal reflux disease) 11/11/2019   Diarrhea 11/11/2019    History reviewed. No pertinent surgical history.     Home Medications    Prior to Admission medications   Medication Sig Start Date End Date Taking? Authorizing Provider  amoxicillin (AMOXIL) 875 MG tablet Take 1 tablet (875 mg total) by mouth 2 (two) times daily for 10 days. 02/11/23 02/21/23 Yes Radford Pax, NP  acamprosate (CAMPRAL) 333 MG tablet Take 2 tablets (666 mg total) by mouth 3 (three) times daily. 08/21/22   Loyola Mast, MD  albuterol (VENTOLIN HFA) 108 (90 Base) MCG/ACT inhaler Inhale 2 puffs into the lungs every 6 (six) hours as needed for wheezing or shortness of breath (Cough). 02/16/22   Theadora Rama Scales, PA-C  fluticasone (FLONASE) 50 MCG/ACT nasal spray Place 1 spray into both nostrils daily. Begin by using 2 sprays in each nare daily for 3 to 5 days, then decrease to 1 spray in each nare daily. 02/16/22   Theadora Rama Scales, PA-C  hydrOXYzine (VISTARIL) 25 MG capsule Take 1 capsule (25 mg total) by mouth at  bedtime as needed. 02/19/22   Loyola Mast, MD  naproxen (NAPROSYN) 375 MG tablet Take 1 tablet (375 mg total) by mouth 2 (two) times daily with a meal. 10/06/22   Wallis Bamberg, PA-C  Olopatadine HCl (PATADAY) 0.2 % SOLN Apply 1 drop to eye daily. 06/17/22   Theadora Rama Scales, PA-C  omeprazole (PRILOSEC) 40 MG capsule Take 2 capsules (80 mg total) by mouth daily. 08/21/22   Loyola Mast, MD  sildenafil (REVATIO) 20 MG tablet Take 1-2 tablets by mouth 30 minutes prior to intercourse. No more than 2 tablets in 24 hours. 02/19/22   Loyola Mast, MD  tiZANidine (ZANAFLEX) 4 MG tablet Take 1 tablet (4 mg total) by mouth at bedtime. 10/06/22   Wallis Bamberg, PA-C  traZODone (DESYREL) 100 MG tablet Take 1 tablet (100 mg total) by mouth at bedtime as needed. 08/21/22   Loyola Mast, MD    Family History Family History  Problem Relation Age of Onset   Asthma Mother    Depression Mother    Stroke Mother    Alcohol abuse Father    Drug abuse Father    Asthma Sister     Social History Social History   Tobacco Use   Smoking status: Former    Packs/day: 0.50    Years: 2.00    Additional pack years: 0.00  Total pack years: 1.00    Types: Cigarettes    Quit date: 2021    Years since quitting: 3.4   Smokeless tobacco: Never  Vaping Use   Vaping Use: Some days  Substance Use Topics   Alcohol use: Yes    Comment: socially   Drug use: No     Allergies   Patient has no known allergies.   Review of Systems Review of Systems  HENT:  Positive for ear pain, rhinorrhea and sore throat.   Musculoskeletal:  Positive for myalgias.     Physical Exam Triage Vital Signs ED Triage Vitals  Enc Vitals Group     BP 02/11/23 0820 123/81     Pulse Rate 02/11/23 0820 80     Resp 02/11/23 0820 16     Temp 02/11/23 0820 98.6 F (37 C)     Temp Source 02/11/23 0820 Oral     SpO2 02/11/23 0820 98 %     Weight --      Height --      Head Circumference --      Peak Flow --      Pain  Score 02/11/23 0819 5     Pain Loc --      Pain Edu? --      Excl. in GC? --    No data found.  Updated Vital Signs BP 123/81 (BP Location: Left Arm)   Pulse 80   Temp 98.6 F (37 C) (Oral)   Resp 16   SpO2 98%   Visual Acuity Right Eye Distance:   Left Eye Distance:   Bilateral Distance:    Right Eye Near:   Left Eye Near:    Bilateral Near:     Physical Exam Vitals and nursing note reviewed.  Constitutional:      General: He is not in acute distress.    Appearance: Normal appearance. He is not ill-appearing or toxic-appearing.  HENT:     Head: Normocephalic and atraumatic.     Right Ear: Tympanic membrane and ear canal normal.     Left Ear: Ear canal normal. Tympanic membrane is erythematous.     Nose: Rhinorrhea present.     Mouth/Throat:     Mouth: Mucous membranes are moist.     Pharynx: Posterior oropharyngeal erythema present.  Eyes:     Pupils: Pupils are equal, round, and reactive to light.  Cardiovascular:     Rate and Rhythm: Normal rate and regular rhythm.     Heart sounds: Normal heart sounds.  Pulmonary:     Effort: Pulmonary effort is normal.     Breath sounds: Normal breath sounds.  Musculoskeletal:     Cervical back: Normal range of motion and neck supple.  Lymphadenopathy:     Cervical: No cervical adenopathy.  Skin:    General: Skin is warm and dry.  Neurological:     General: No focal deficit present.     Mental Status: He is alert and oriented to person, place, and time.  Psychiatric:        Mood and Affect: Mood normal.        Behavior: Behavior normal.      UC Treatments / Results  Labs (all labs ordered are listed, but only abnormal results are displayed) Labs Reviewed  POCT RAPID STREP A (OFFICE)    EKG   Radiology No results found.  Procedures Procedures (including critical care time)  Medications Ordered in UC Medications - No data to display  Initial Impression / Assessment and Plan / UC Course  I have  reviewed the triage vital signs and the nursing notes.  Pertinent labs & imaging results that were available during my care of the patient were reviewed by me and considered in my medical decision making (see chart for details).     Reviewed exam and symptoms with patient.  Negative rapid strep.  Will start amoxicillin for left otitis media Salt water gargles and warm liquids Over-the-counter Tylenol or ibuprofen as needed Rest and fluids PCP follow-up if symptoms do not improve ER precautions reviewed and patient verbalized understanding Final Clinical Impressions(s) / UC Diagnoses   Final diagnoses:  Viral pharyngitis  Non-recurrent acute suppurative otitis media of left ear without spontaneous rupture of tympanic membrane     Discharge Instructions      Your strep test was negative for strep throat.  A prescription for amoxicillin has been sent to your pharmacy that you will take twice daily for 10 days to treat your ear infection Salt water gargles and warm liquids Over-the-counter Tylenol ibuprofen as needed Please follow-up with your PCP if your symptoms do not improve Please go to the ER for any worsening symptoms   ED Prescriptions     Medication Sig Dispense Auth. Provider   amoxicillin (AMOXIL) 875 MG tablet Take 1 tablet (875 mg total) by mouth 2 (two) times daily for 10 days. 20 tablet Radford Pax, NP      PDMP not reviewed this encounter.   Radford Pax, NP 02/11/23 [phone]

## 2023-02-11 NOTE — ED Triage Notes (Signed)
Pt presents with c/o sore throat x 2 days. The sore throat has worsened this morning, c/o ear and body aching.   Pt has only taken tylenol for relief.

## 2023-02-11 NOTE — Discharge Instructions (Signed)
Your strep test was negative for strep throat.  A prescription for amoxicillin has been sent to your pharmacy that you will take twice daily for 10 days to treat your ear infection Salt water gargles and warm liquids Over-the-counter Tylenol ibuprofen as needed Please follow-up with your PCP if your symptoms do not improve Please go to the ER for any worsening symptoms

## 2023-02-19 ENCOUNTER — Ambulatory Visit: Payer: Self-pay | Admitting: Family Medicine

## 2023-02-20 ENCOUNTER — Other Ambulatory Visit (HOSPITAL_COMMUNITY): Payer: Self-pay

## 2023-04-21 ENCOUNTER — Other Ambulatory Visit (HOSPITAL_COMMUNITY): Payer: Self-pay

## 2023-04-21 ENCOUNTER — Ambulatory Visit
Admission: EM | Admit: 2023-04-21 | Discharge: 2023-04-21 | Disposition: A | Discharge status: Home / Self Care | Payer: Self-pay | Attending: Internal Medicine | Admitting: Internal Medicine

## 2023-04-21 DIAGNOSIS — Z1152 Encounter for screening for COVID-19: Secondary | ICD-10-CM | POA: Insufficient documentation

## 2023-04-21 DIAGNOSIS — J029 Acute pharyngitis, unspecified: Secondary | ICD-10-CM | POA: Insufficient documentation

## 2023-04-21 DIAGNOSIS — R051 Acute cough: Secondary | ICD-10-CM | POA: Insufficient documentation

## 2023-04-21 DIAGNOSIS — B349 Viral infection, unspecified: Secondary | ICD-10-CM | POA: Insufficient documentation

## 2023-04-21 LAB — POCT RAPID STREP A (OFFICE): Rapid Strep A Screen: NEGATIVE

## 2023-04-21 MED ORDER — BENZONATATE 200 MG PO CAPS
200.0000 mg | ORAL_CAPSULE | Freq: Three times a day (TID) | ORAL | 0 refills | Status: DC | PRN
  Filled 2023-04-21: qty 20, 7d supply, fill #0

## 2023-04-21 MED ORDER — ONDANSETRON 4 MG PO TBDP
4.0000 mg | ORAL_TABLET | Freq: Three times a day (TID) | ORAL | 0 refills | Status: DC | PRN
  Filled 2023-04-21: qty 10, 4d supply, fill #0

## 2023-04-21 NOTE — ED Triage Notes (Signed)
Pt presents with c/o sore throat, runny nose, stomach pain x 2 days.   States he wants a COVID test because of an out break at work. Denies fever.   Pt has taken tylenol.

## 2023-04-21 NOTE — ED Provider Notes (Signed)
[facility]    CSN: [id number] Arrival date & time: 04/21/23  0803      History   Chief Complaint Chief Complaint  Patient presents with   Nasal Congestion   Abdominal Pain   Sore Throat    HPI Juan Esparza is a 42 y.o. male  presents for evaluation of URI symptoms for 2 days. Patient reports associated symptoms of sore throat, cough, congestion, nausea, nonbloody vomiting x 2 and nonbloody diarrhea x 2. Denies fevers, ear pain, body aches, shortness of breath. Patient does note have a hx of asthma or smoking.  Reports exposure to COVID via coworkers.  He has had COVID in the past without complication or hospitalization.  Pt has taken Tylenol OTC for symptoms. Pt has no other concerns at this time.    Abdominal Pain Associated symptoms: cough, diarrhea, nausea, sore throat and vomiting   Sore Throat    Past Medical History:  Diagnosis Date   GERD (gastroesophageal reflux disease)    Sleep apnea     Patient Active Problem List   Diagnosis Date Noted   Chronic insomnia 12/25/2020   Anxiety 12/25/2020   Seasonal allergies 12/25/2020   Alcohol use disorder, mild, abuse 04/27/2020   GERD (gastroesophageal reflux disease) 11/11/2019   Diarrhea 11/11/2019    History reviewed. No pertinent surgical history.     Home Medications    Prior to Admission medications   Medication Sig Start Date End Date Taking? Authorizing Provider  benzonatate (TESSALON) 200 MG capsule Take 1 capsule (200 mg total) by mouth 3 (three) times daily as needed. 04/21/23  Yes Radford Pax, NP  ondansetron (ZOFRAN-ODT) 4 MG disintegrating tablet Take 1 tablet (4 mg total) by mouth every 8 (eight) hours as needed for nausea or vomiting. 04/21/23  Yes Radford Pax, NP  acamprosate (CAMPRAL) 333 MG tablet Take 2 tablets (666 mg total) by mouth 3 (three) times daily. 08/21/22   Loyola Mast, MD  albuterol (VENTOLIN HFA) 108 (90 Base) MCG/ACT inhaler Inhale 2 puffs into the lungs every  6 (six) hours as needed for wheezing or shortness of breath (Cough). 02/16/22   Theadora Rama Scales, PA-C  fluticasone (FLONASE) 50 MCG/ACT nasal spray Place 1 spray into both nostrils daily. Begin by using 2 sprays in each nare daily for 3 to 5 days, then decrease to 1 spray in each nare daily. 02/16/22   Theadora Rama Scales, PA-C  hydrOXYzine (VISTARIL) 25 MG capsule Take 1 capsule (25 mg total) by mouth at bedtime as needed. 02/19/22   Loyola Mast, MD  naproxen (NAPROSYN) 375 MG tablet Take 1 tablet (375 mg total) by mouth 2 (two) times daily with a meal. 10/06/22   Wallis Bamberg, PA-C  Olopatadine HCl (PATADAY) 0.2 % SOLN Apply 1 drop to eye daily. 06/17/22   Theadora Rama Scales, PA-C  omeprazole (PRILOSEC) 40 MG capsule Take 2 capsules (80 mg total) by mouth daily. 08/21/22   Loyola Mast, MD  sildenafil (REVATIO) 20 MG tablet Take 1-2 tablets by mouth 30 minutes prior to intercourse. No more than 2 tablets in 24 hours. 02/19/22   Loyola Mast, MD  tiZANidine (ZANAFLEX) 4 MG tablet Take 1 tablet (4 mg total) by mouth at bedtime. 10/06/22   Wallis Bamberg, PA-C  traZODone (DESYREL) 100 MG tablet Take 1 tablet (100 mg total) by mouth at bedtime as needed. 08/21/22   Loyola Mast, MD    Family History Family History  Problem  Relation Age of Onset   Asthma Mother    Depression Mother    Stroke Mother    Alcohol abuse Father    Drug abuse Father    Asthma Sister     Social History Social History   Tobacco Use   Smoking status: Former    Current packs/day: 0.00    Average packs/day: 0.5 packs/day for 2.0 years (1.0 ttl pk-yrs)    Types: Cigarettes    Start date: 2019    Quit date: 2021    Years since quitting: 3.6   Smokeless tobacco: Never  Vaping Use   Vaping status: Some Days  Substance Use Topics   Alcohol use: Yes    Comment: socially   Drug use: No     Allergies   Patient has no known allergies.   Review of Systems Review of Systems  HENT:  Positive for  congestion and sore throat.   Respiratory:  Positive for cough.   Gastrointestinal:  Positive for diarrhea, nausea and vomiting.     Physical Exam Triage Vital Signs ED Triage Vitals  Encounter Vitals Group     BP 04/21/23 0815 (!) 148/112     Systolic BP Percentile --      Diastolic BP Percentile --      Pulse Rate 04/21/23 0815 74     Resp 04/21/23 0815 18     Temp 04/21/23 0815 98.5 F (36.9 C)     Temp Source 04/21/23 0815 Oral     SpO2 04/21/23 0815 96 %     Weight --      Height --      Head Circumference --      Peak Flow --      Pain Score 04/21/23 0814 4     Pain Loc --      Pain Education --      Exclude from Growth Chart --    No data found.  Updated Vital Signs BP (!) 148/112 (BP Location: Right Arm)   Pulse 74   Temp 98.5 F (36.9 C) (Oral)   Resp 18   SpO2 96%   Visual Acuity Right Eye Distance:   Left Eye Distance:   Bilateral Distance:    Right Eye Near:   Left Eye Near:    Bilateral Near:     Physical Exam Vitals and nursing note reviewed.  Constitutional:      General: He is not in acute distress.    Appearance: Normal appearance. He is not ill-appearing or toxic-appearing.  HENT:     Head: Normocephalic and atraumatic.     Right Ear: Tympanic membrane and ear canal normal.     Left Ear: Tympanic membrane and ear canal normal.     Nose: Congestion present.     Mouth/Throat:     Mouth: Mucous membranes are moist.     Pharynx: Posterior oropharyngeal erythema present.  Eyes:     Pupils: Pupils are equal, round, and reactive to light.  Cardiovascular:     Rate and Rhythm: Normal rate and regular rhythm.     Heart sounds: Normal heart sounds.  Pulmonary:     Effort: Pulmonary effort is normal.     Breath sounds: Normal breath sounds.  Musculoskeletal:     Cervical back: Normal range of motion and neck supple.  Lymphadenopathy:     Cervical: No cervical adenopathy.  Skin:    General: Skin is warm and dry.  Neurological:      General:  No focal deficit present.     Mental Status: He is alert and oriented to person, place, and time.  Psychiatric:        Mood and Affect: Mood normal.        Behavior: Behavior normal.      UC Treatments / Results  Labs (all labs ordered are listed, but only abnormal results are displayed) Labs Reviewed  SARS CORONAVIRUS 2 (TAT 6-24 HRS)  CULTURE, GROUP A STREP [facility])  POCT RAPID STREP A (OFFICE)    EKG   Radiology No results found.  Procedures Procedures (including critical care time)  Medications Ordered in UC Medications - No data to display  Initial Impression / Assessment and Plan / UC Course  I have reviewed the triage vital signs and the nursing notes.  Pertinent labs & imaging results that were available during my care of the patient were reviewed by me and considered in my medical decision making (see chart for details).     Reviewed exam and symptoms with patient.  No red flags.  Negative rapid strep, will culture.  COVID PCR and will contact if positive.  Reviewed viral illness and symptomatic treatment.  Tessalon as needed for cough.  Zofran as needed for nausea/vomiting.  PCP follow-up if symptoms do not improve.  ER precautions reviewed and patient verbalized understanding. Final Clinical Impressions(s) / UC Diagnoses   Final diagnoses:  Sore throat  Acute cough  Viral illness     Discharge Instructions      The clinic will contact you with results of the COVID test done today if it is positive.  You may take Tessalon as needed for your cough.  Zofran every 8 hours as needed for nausea or vomiting.  Lots of rest and fluids.  Please follow-up with your PCP if your symptoms are not improving.  Please go to the ER for any worsening symptoms.  I hope you feel better soon!     ED Prescriptions     Medication Sig Dispense Auth. Provider   benzonatate (TESSALON) 200 MG capsule Take 1 capsule (200 mg total) by mouth 3 (three) times daily as  needed. 20 capsule Radford Pax, NP   ondansetron (ZOFRAN-ODT) 4 MG disintegrating tablet Take 1 tablet (4 mg total) by mouth every 8 (eight) hours as needed for nausea or vomiting. 10 tablet Radford Pax, NP      PDMP not reviewed this encounter.   Radford Pax, NP 04/21/23 [phone]

## 2023-04-21 NOTE — Discharge Instructions (Signed)
The clinic will contact you with results of the COVID test done today if it is positive.  You may take Tessalon as needed for your cough.  Zofran every 8 hours as needed for nausea or vomiting.  Lots of rest and fluids.  Please follow-up with your PCP if your symptoms are not improving.  Please go to the ER for any worsening symptoms.  I hope you feel better soon!

## 2023-04-22 LAB — SARS CORONAVIRUS 2 (TAT 6-24 HRS): SARS Coronavirus 2: NEGATIVE

## 2023-04-24 LAB — CULTURE, GROUP A STREP (THRC)

## 2023-05-01 ENCOUNTER — Other Ambulatory Visit (HOSPITAL_COMMUNITY): Payer: Self-pay

## 2023-06-13 ENCOUNTER — Ambulatory Visit: Payer: BC Managed Care – PPO | Admitting: Family Medicine

## 2023-06-13 ENCOUNTER — Encounter: Payer: Self-pay | Admitting: Family Medicine

## 2023-06-13 VITALS — BP 134/82 | HR 75 | Temp 97.9°F | Ht 66.5 in | Wt 151.6 lb

## 2023-06-13 DIAGNOSIS — R0683 Snoring: Secondary | ICD-10-CM | POA: Insufficient documentation

## 2023-06-13 DIAGNOSIS — R112 Nausea with vomiting, unspecified: Secondary | ICD-10-CM | POA: Diagnosis not present

## 2023-06-13 DIAGNOSIS — N529 Male erectile dysfunction, unspecified: Secondary | ICD-10-CM

## 2023-06-13 DIAGNOSIS — K219 Gastro-esophageal reflux disease without esophagitis: Secondary | ICD-10-CM

## 2023-06-13 DIAGNOSIS — R1013 Epigastric pain: Secondary | ICD-10-CM

## 2023-06-13 DIAGNOSIS — J301 Allergic rhinitis due to pollen: Secondary | ICD-10-CM

## 2023-06-13 MED ORDER — ONDANSETRON 4 MG PO TBDP: 4.0000 mg | ORAL_TABLET | Freq: Three times a day (TID) | ORAL | 0 refills | Status: DC | PRN

## 2023-06-13 MED ORDER — FLUTICASONE PROPIONATE 50 MCG/ACT NA SUSP: 1.0000 | Freq: Every day | NASAL | 2 refills | Status: AC

## 2023-06-13 MED ORDER — OLOPATADINE HCL 0.2 % OP SOLN: 1.0000 [drp] | Freq: Every day | OPHTHALMIC | 1 refills | Status: AC

## 2023-06-13 MED ORDER — DEXLANSOPRAZOLE 30 MG PO CPDR: 30.0000 mg | DELAYED_RELEASE_CAPSULE | Freq: Every day | ORAL | 3 refills | Status: DC

## 2023-06-13 MED ORDER — SILDENAFIL CITRATE 50 MG PO TABS: 50.0000 mg | ORAL_TABLET | Freq: Every day | ORAL | 5 refills | Status: DC | PRN

## 2023-06-13 NOTE — Assessment & Plan Note (Signed)
I will prescribe dexlansoprazole to see if this controls symptoms better. I advise that he stop drinking alcohol in the evenings.

## 2023-06-13 NOTE — Assessment & Plan Note (Signed)
Stable. Continue olapatadine drops and fluticasone nasal spray.

## 2023-06-13 NOTE — Assessment & Plan Note (Signed)
At risk for sleep apnea. I will refer him for a sleep study.

## 2023-06-13 NOTE — Progress Notes (Signed)
[facility] [address] [place] [address] Dept: [phone] Dept Fax: [phone]  Chronic Care Office Visit  Subjective:    Patient ID: Juan Esparza, male    DOB: 01-12-1981, 42 y.o..   MRN: [id number]  Chief Complaint  Patient presents with   Follow-up    6 month f/u.  C.o having N&V in mornings, not sleeping well, HA's in am (taking Tylenol), diarrhea x yrs   History of Present Illness:  Patient is in today for reassessment of chronic medical issues.  Mr. Skibinski has a history of GERD. He is managed on omeprazole 40 mg daily. He feels his heartburn is not well controlled. His wife asks about dexlansoprazole (Dexilant) as an alternative. Mr. Platte does continue to drink 2 [place] each evening.  Mr. Jenkinson has a history of early morning nausea and vomiting most days. He has some associated epigastric pain. he has not had any hematemesis.   M.r Kruzel's wife, Shaune Pascal', notes a concern for his snoring. She states this is worse on nights after he has been drinking. Only once has she heard him pause his breathing at night. He denies any daytime hypersomnolence. He does have nonrestorative sleep at times.  Mr. Stipes has a history of ED. He has been on sildenafil 20 mg, but is not finding this very effective for him.  Past Medical History: Patient Active Problem List   Diagnosis Date Noted   Erectile dysfunction 06/13/2023   Seasonal allergic rhinitis due to pollen 06/13/2023   Snoring 06/13/2023   Chronic insomnia 12/25/2020   Anxiety 12/25/2020   Seasonal allergies 12/25/2020   Alcohol use disorder, mild, abuse 04/27/2020   GERD (gastroesophageal reflux disease) 11/11/2019   Chronic diarrhea 11/11/2019   History reviewed. No pertinent surgical history. Family History  Problem Relation Age of Onset   Asthma Mother    Depression Mother    Stroke Mother    Alcohol abuse Father    Drug abuse Father     Asthma Sister    Outpatient Medications Prior to Visit  Medication Sig Dispense Refill   albuterol (VENTOLIN HFA) 108 (90 Base) MCG/ACT inhaler Inhale 2 puffs into the lungs every 6 (six) hours as needed for wheezing or shortness of breath (Cough). 18 g 0   benzonatate (TESSALON) 200 MG capsule Take 1 capsule (200 mg total) by mouth 3 (three) times daily as needed. 20 capsule 0   tiZANidine (ZANAFLEX) 4 MG tablet Take 1 tablet (4 mg total) by mouth at bedtime. 30 tablet 0   traZODone (DESYREL) 100 MG tablet Take 1 tablet (100 mg total) by mouth at bedtime as needed. 90 tablet 3   fluticasone (FLONASE) 50 MCG/ACT nasal spray Place 1 spray into both nostrils daily. Begin by using 2 sprays in each nare daily for 3 to 5 days, then decrease to 1 spray in each nare daily. 15.8 mL 2   Olopatadine HCl (PATADAY) 0.2 % SOLN Apply 1 drop to eye daily. 2.5 mL 1   omeprazole (PRILOSEC) 40 MG capsule Take 2 capsules (80 mg total) by mouth daily. 180 capsule 3   ondansetron (ZOFRAN-ODT) 4 MG disintegrating tablet Take 1 tablet (4 mg total) by mouth every 8 (eight) hours as needed for nausea or vomiting. 10 tablet 0   sildenafil (REVATIO) 20 MG tablet Take 1-2 tablets by mouth 30 minutes prior to intercourse. No more than 2 tablets in 24 hours. 20 tablet 0   acamprosate (CAMPRAL) 333  MG tablet Take 2 tablets (666 mg total) by mouth 3 (three) times daily. (Patient not taking: Reported on 06/13/2023) 180 tablet 2   hydrOXYzine (VISTARIL) 25 MG capsule Take 1 capsule (25 mg total) by mouth at bedtime as needed. (Patient not taking: Reported on 06/13/2023) 30 capsule 6   naproxen (NAPROSYN) 375 MG tablet Take 1 tablet (375 mg total) by mouth 2 (two) times daily with a meal. 30 tablet 0   No facility-administered medications prior to visit.   No Known Allergies Objective:   Today's Vitals   06/13/23 1530  BP: 134/82  Pulse: 75  Temp: 97.9 F (36.6 C)  TempSrc: Temporal  SpO2: 99%  Weight: 151 lb 9.6 oz  (68.8 kg)  Height: 5' 6.5" (1.689 m)   Body mass index is 24.1 kg/m.  Neck circumference: 39 cm  General: Well developed, well nourished. No acute distress. HEENT: Normocephalic, non-traumatic. Mucous membranes moist. Oropharynx clear. Good dentition. Abdomen: Soft, non-tender. Bowel sounds positive, normal pitch and frequency. No   hepatosplenomegaly. No rebound or guarding. Psych: Alert and oriented. Normal mood and affect.  There are no preventive care reminders to display for this patient.    STOP-Bang Score for Sleep Apnea Screening  Patient Self-Reported Questions         Score Do you snore loudly? (louder than  1  talking or sufficiently loud to be  heard through doors)  Do you often feel tired, fatigued, or  0  sleepy during the daytime?  Has anyone observed you stop breathing 1  during sleep?  Do you have (or are you being treated for) 1  high blood pressure?  Clinical Information          Score BMI>35 kg/m2     0  Age > 50 years    0  Neck circumference > 40 cm   0  Gender (male)     1   Total      4  A score <3 indicates a low risk of sleep apnea.  Assessment & Plan:   Problem List Items Addressed This Visit       Respiratory   Seasonal allergic rhinitis due to pollen    Stable. Continue olapatadine drops and fluticasone nasal spray.      Relevant Medications   fluticasone (FLONASE) 50 MCG/ACT nasal spray   Olopatadine HCl (PATADAY) 0.2 % SOLN     Digestive   GERD (gastroesophageal reflux disease) - Primary    I will prescribe dexlansoprazole to see if this controls symptoms better. I advise that he stop drinking alcohol in the evenings.      Relevant Medications   Dexlansoprazole 30 MG capsule DR   ondansetron (ZOFRAN-ODT) 4 MG disintegrating tablet   Other Relevant Orders   Ambulatory referral to Gastroenterology     Other   Erectile dysfunction    Recommend we increase his dose of sildenafil.      Relevant Medications    sildenafil (VIAGRA) 50 MG tablet   Snoring    At risk for sleep apnea. I will refer him for a sleep study.      Relevant Orders   Ambulatory referral to Sleep Studies   Other Visit Diagnoses     Nausea and vomiting, unspecified vomiting type       Etiology unclear. I will check labs and order an ultrasound to assess for possibel causes. I will provide Zofrma to manage symptoms.   Relevant Medications   ondansetron (ZOFRAN-ODT) 4 MG  disintegrating tablet   Other Relevant Orders   Ambulatory referral to Gastroenterology   Comprehensive metabolic panel   CBC   Lipase   US ABDOMEN LIMITED RUQ (LIVER/GB)   Epigastric pain       Etiology unclear. I will check labs and order an ultrasound to assess for possibel causes. I will provide Zofrma to manage symptoms.   Relevant Orders   Ambulatory referral to Gastroenterology   Comprehensive metabolic panel   CBC   Lipase   US ABDOMEN LIMITED RUQ (LIVER/GB)       Return in about 4 weeks (around 07/11/2023) for Reassessment.   Loyola Mast, MD

## 2023-06-13 NOTE — Assessment & Plan Note (Signed)
Recommend we increase his dose of sildenafil.

## 2023-06-14 LAB — CBC
Hematocrit: 43.7 % (ref 37.5–51.0)
Hemoglobin: 14.3 g/dL (ref 13.0–17.7)
MCH: 27.7 pg (ref 26.6–33.0)
MCHC: 32.7 g/dL (ref 31.5–35.7)
MCV: 85 fL (ref 79–97)
Platelets: 298 10*3/uL (ref 150–450)
RBC: 5.16 x10E6/uL (ref 4.14–5.80)
RDW: 13.7 % (ref 11.6–15.4)
WBC: 7.7 10*3/uL (ref 3.4–10.8)

## 2023-06-14 LAB — COMPREHENSIVE METABOLIC PANEL
ALT: 21 [IU]/L (ref 0–44)
AST: 16 [IU]/L (ref 0–40)
Albumin: 4.6 g/dL (ref 4.1–5.1)
Alkaline Phosphatase: 64 [IU]/L (ref 44–121)
BUN/Creatinine Ratio: 16 (ref 9–20)
BUN: 15 mg/dL (ref 6–24)
Bilirubin Total: 0.4 mg/dL (ref 0.0–1.2)
CO2: 19 mmol/L — ABNORMAL LOW (ref 20–29)
Calcium: 9.2 mg/dL (ref 8.7–10.2)
Chloride: 105 mmol/L (ref 96–106)
Creatinine, Ser: 0.93 mg/dL (ref 0.76–1.27)
Globulin, Total: 2.2 g/dL (ref 1.5–4.5)
Glucose: 131 mg/dL — ABNORMAL HIGH (ref 70–99)
Potassium: 3.8 mmol/L (ref 3.5–5.2)
Sodium: 143 mmol/L (ref 134–144)
Total Protein: 6.8 g/dL (ref 6.0–8.5)
eGFR: 106 mL/min/{1.73_m2} (ref >59)

## 2023-06-14 LAB — LIPASE: Lipase: 50 U/L (ref 13–78)

## 2023-06-18 ENCOUNTER — Other Ambulatory Visit: Payer: BC Managed Care – PPO

## 2023-06-24 ENCOUNTER — Other Ambulatory Visit: Payer: Self-pay | Admitting: Family Medicine

## 2023-06-24 ENCOUNTER — Ambulatory Visit
Admission: RE | Admit: 2023-06-24 | Discharge: 2023-06-24 | Disposition: A | Discharge status: Home / Self Care | Payer: BC Managed Care – PPO | Source: Ambulatory Visit | Attending: Family Medicine

## 2023-06-24 DIAGNOSIS — R112 Nausea with vomiting, unspecified: Secondary | ICD-10-CM

## 2023-06-24 DIAGNOSIS — R1013 Epigastric pain: Secondary | ICD-10-CM

## 2023-06-24 DIAGNOSIS — K219 Gastro-esophageal reflux disease without esophagitis: Secondary | ICD-10-CM

## 2023-06-27 ENCOUNTER — Telehealth: Payer: Self-pay

## 2023-06-27 ENCOUNTER — Other Ambulatory Visit (HOSPITAL_COMMUNITY): Payer: Self-pay

## 2023-06-27 NOTE — Telephone Encounter (Signed)
Pharmacy Patient Advocate Encounter   Received notification from CoverMyMeds that prior authorization for Dexlansoprazole 30MG  dr capsules is required/requested.   Insurance verification completed.   The patient is insured through [employer] .   Per test claim: PA required; PA submitted to [employer] [facility] via CoverMyMeds Key/confirmation #/EOC [id number] Status is pending

## 2023-06-30 ENCOUNTER — Other Ambulatory Visit: Payer: Self-pay | Admitting: Family Medicine

## 2023-06-30 DIAGNOSIS — R112 Nausea with vomiting, unspecified: Secondary | ICD-10-CM

## 2023-07-01 ENCOUNTER — Encounter: Payer: Self-pay | Admitting: Family Medicine

## 2023-07-01 ENCOUNTER — Other Ambulatory Visit: Payer: Self-pay | Admitting: Family Medicine

## 2023-07-01 DIAGNOSIS — K219 Gastro-esophageal reflux disease without esophagitis: Secondary | ICD-10-CM

## 2023-07-01 MED ORDER — ONDANSETRON 4 MG PO TBDP
4.0000 mg | ORAL_TABLET | Freq: Three times a day (TID) | ORAL | 0 refills | Status: DC | PRN
Start: 2023-07-01 — End: 2024-03-11

## 2023-07-01 NOTE — Telephone Encounter (Signed)
Pharmacy Patient Advocate Encounter  Received notification from [employer] [facility] that Prior Authorization for Dexlansoprazole 30MG  dr capsules has been DENIED.  Full denial letter will be uploaded to the media tab. See denial reason below.  Your plan only covers this drug when you meet one of these options: A) You have tried other drugs your plan covers (preferred drugs), and they did not work well for you, or B) Your doctor gives [place] a medical reason you cannot take those other drugs. For your plan, you may need to try up to three preferred drugs. The preferred drugs for your plan are: esomeprazole delayed-rel, lansoprazole delayed-rel capsule, omeprazole delayed-rel, pantoprazole delayed-rel tablet.  PA #/Case ID/Reference #: [id number]  Please be advised we currently do not have a Pharmacist to review denials, therefore you will need to process appeals accordingly as needed. Thanks for your support at this time. Contact for appeals are as follows: Phone: [phone], Fax: [phone]

## 2023-07-07 ENCOUNTER — Other Ambulatory Visit (HOSPITAL_COMMUNITY): Payer: Self-pay

## 2023-07-07 MED ORDER — OMEPRAZOLE 40 MG PO CPDR
40.0000 mg | DELAYED_RELEASE_CAPSULE | Freq: Every day | ORAL | 3 refills | Status: DC
  Filled 2023-07-07 – 2023-07-22 (×2): qty 30, 30d supply, fill #0
  Filled 2023-08-20: qty 30, 30d supply, fill #1
  Filled 2023-09-29 – 2023-10-02 (×2): qty 30, 30d supply, fill #2
  Filled 2023-11-03: qty 30, 30d supply, fill #3
  Filled 2023-11-27: qty 30, 30d supply, fill #4
  Filled 2023-12-29: qty 30, 30d supply, fill #5

## 2023-07-07 NOTE — Telephone Encounter (Signed)
Message sent to provider advising of this. Dm/cma

## 2023-07-07 NOTE — Telephone Encounter (Signed)
Patient's insurance will not cover the Dexalant, he would like to go back on the one he was on prior.  Please review and advise. Thanks. Dm/cma

## 2023-07-11 ENCOUNTER — Encounter: Payer: Self-pay | Admitting: Family Medicine

## 2023-07-11 ENCOUNTER — Ambulatory Visit: Payer: BC Managed Care – PPO | Admitting: Family Medicine

## 2023-07-11 VITALS — BP 132/84 | HR 73 | Temp 98.1°F | Ht 66.5 in | Wt 155.0 lb

## 2023-07-11 DIAGNOSIS — F5104 Psychophysiologic insomnia: Secondary | ICD-10-CM | POA: Diagnosis not present

## 2023-07-11 DIAGNOSIS — R739 Hyperglycemia, unspecified: Secondary | ICD-10-CM | POA: Insufficient documentation

## 2023-07-11 DIAGNOSIS — K529 Noninfective gastroenteritis and colitis, unspecified: Secondary | ICD-10-CM | POA: Diagnosis not present

## 2023-07-11 DIAGNOSIS — F1010 Alcohol abuse, uncomplicated: Secondary | ICD-10-CM

## 2023-07-11 DIAGNOSIS — J302 Other seasonal allergic rhinitis: Secondary | ICD-10-CM

## 2023-07-11 DIAGNOSIS — K219 Gastro-esophageal reflux disease without esophagitis: Secondary | ICD-10-CM | POA: Diagnosis not present

## 2023-07-11 MED ORDER — ALBUTEROL SULFATE HFA 108 (90 BASE) MCG/ACT IN AERS: 2.0000 | INHALATION_SPRAY | Freq: Four times a day (QID) | RESPIRATORY_TRACT | 0 refills | Status: AC | PRN

## 2023-07-11 MED ORDER — TRAZODONE HCL 100 MG PO TABS: 100.0000 mg | ORAL_TABLET | Freq: Every evening | ORAL | 3 refills | Status: DC | PRN

## 2023-07-11 MED ORDER — HYDROXYZINE PAMOATE 25 MG PO CAPS: 25.0000 mg | ORAL_CAPSULE | Freq: Every evening | ORAL | 6 refills | Status: AC | PRN

## 2023-07-11 MED ORDER — ACAMPROSATE CALCIUM 333 MG PO TBEC: 666.0000 mg | DELAYED_RELEASE_TABLET | Freq: Three times a day (TID) | ORAL | 2 refills | Status: AC

## 2023-07-11 NOTE — Assessment & Plan Note (Addendum)
Stable. Continue hydroxyzine and trazodone.

## 2023-07-11 NOTE — Assessment & Plan Note (Signed)
-   GI referral pending

## 2023-07-11 NOTE — Assessment & Plan Note (Signed)
Continue omeprazole 40 mg daily

## 2023-07-11 NOTE — Progress Notes (Signed)
[facility] [address] [place] [address] Dept: [phone] Dept Fax: [phone]  Chronic Care Office Visit  Subjective:    Patient ID: Juan Esparza, male    DOB: [birth year], 42 y.o..   MRN: [id number]  Chief Complaint  Patient presents with   Follow-up    4 month f/u. No concerns.     History of Present Illness:  Patient is in today for reassessment of chronic medical issues.  Mr. Juan Esparza has a history of GERD. He is managed on omeprazole 40 mg daily. At his last appointment, his wife had wanted him to be switched to dexlansoprazole (Dexilant), however, his insurance would not cover this, so he is back on omeprazole. He continues to have some GI upset and some chronic diarrhea. He ahs not been scheduled ot see the gastroenterologist yet.    Mr. Juan Esparza has a history of ED. He was not having much success with sildenafil at 20 mg. He is now using 100 mg and finds this is much more effective.  Past Medical History: Patient Active Problem List   Diagnosis Date Noted   Hyperglycemia 07/11/2023   Erectile dysfunction 06/13/2023   Seasonal allergic rhinitis due to pollen 06/13/2023   Snoring 06/13/2023   Chronic insomnia 12/25/2020   Anxiety 12/25/2020   Seasonal allergies 12/25/2020   Alcohol use disorder, mild, abuse 04/27/2020   GERD (gastroesophageal reflux disease) 11/11/2019   Chronic diarrhea 11/11/2019   History reviewed. No pertinent surgical history. Family History  Problem Relation Age of Onset   Asthma Mother    Depression Mother    Stroke Mother    Alcohol abuse Father    Drug abuse Father    Asthma Sister    Outpatient Medications Prior to Visit  Medication Sig Dispense Refill   fluticasone (FLONASE) 50 MCG/ACT nasal spray Place 1 spray into both nostrils daily. Begin by using 2 sprays in each nare daily for 3 to 5 days, then decrease to 1 spray in each nare daily. 15.8 mL 2   Olopatadine HCl  (PATADAY) 0.2 % SOLN Apply 1 drop to eye daily. 2.5 mL 1   omeprazole (PRILOSEC) 40 MG capsule Take 1 capsule (40 mg total) by mouth daily. 90 capsule 3   ondansetron (ZOFRAN-ODT) 4 MG disintegrating tablet Take 1 tablet (4 mg total) by mouth every 8 (eight) hours as needed for nausea or vomiting. 10 tablet 0   sildenafil (VIAGRA) 50 MG tablet Take 1-2 tablets (50-100 mg total) by mouth daily as needed for erectile dysfunction. 30 tablet 5   tiZANidine (ZANAFLEX) 4 MG tablet Take 1 tablet (4 mg total) by mouth at bedtime. 30 tablet 0   acamprosate (CAMPRAL) 333 MG tablet Take 2 tablets (666 mg total) by mouth 3 (three) times daily. 180 tablet 2   albuterol (VENTOLIN HFA) 108 (90 Base) MCG/ACT inhaler Inhale 2 puffs into the lungs every 6 (six) hours as needed for wheezing or shortness of breath (Cough). 18 g 0   hydrOXYzine (VISTARIL) 25 MG capsule Take 1 capsule (25 mg total) by mouth at bedtime as needed. 30 capsule 6   traZODone (DESYREL) 100 MG tablet Take 1 tablet (100 mg total) by mouth at bedtime as needed. 90 tablet 3   benzonatate (TESSALON) 200 MG capsule Take 1 capsule (200 mg total) by mouth 3 (three) times daily as needed. (Patient not taking: Reported on 07/11/2023) 20 capsule 0   No facility-administered medications prior to visit.  No Known Allergies Objective:   Today's Vitals   07/11/23 1448  BP: 132/84  Pulse: 73  Temp: 98.1 F (36.7 C)  TempSrc: Temporal  SpO2: 98%  Weight: 155 lb (70.3 kg)  Height: 5' 6.5" (1.689 m)   Body mass index is 24.64 kg/m.   General: Well developed, well nourished. No acute distress. Psych: Alert and oriented. Normal mood and affect.  There are no preventive care reminders to display for this patient.  Lab Results    Latest Ref Rng & Units 06/13/2023    4:28 PM 12/29/2017    6:45 PM 04/28/2017    2:50 PM  CBC  WBC 3.4 - 10.8 x10E3/uL 7.7  6.5  7.4   Hemoglobin 13.0 - 17.7 g/dL 16.1  09.6  04.5   Hematocrit 37.5 - 51.0 % 43.7   42.6  44.9   Platelets 150 - 450 x10E3/uL 298  231  273       Latest Ref Rng & Units 06/13/2023    4:28 PM 12/29/2017    6:45 PM 04/28/2017    2:50 PM  CMP  Glucose 70 - 99 mg/dL 409  811  94   BUN 6 - 24 mg/dL 15  11  20    Creatinine 0.76 - 1.27 mg/dL 9.14  7.82  9.56   Sodium 134 - 144 mmol/L 143  138  139   Potassium 3.5 - 5.2 mmol/L 3.8  3.9  4.7   Chloride 96 - 106 mmol/L 105  103  100   CO2 20 - 29 mmol/L 19  24  24    Calcium 8.7 - 10.2 mg/dL 9.2  9.0  21.3   Total Protein 6.0 - 8.5 g/dL 6.8  6.7  7.8   Total Bilirubin 0.0 - 1.2 mg/dL 0.4  0.9  0.6   Alkaline Phos 44 - 121 IU/L 64  47  45   AST 0 - 40 IU/L 16  34  25   ALT 0 - 44 IU/L 21  29  21     Lipase     Component Value Date/Time   LIPASE 50 06/13/2023 1628   Imaging: RUQ Ultrasound (06/24/2023) IMPRESSION: No cholelithiasis or sonographic evidence for acute cholecystitis.    Assessment & Plan:   Problem List Items Addressed This Visit       Digestive   Chronic diarrhea    GI referral pending.      GERD (gastroesophageal reflux disease) - Primary    Continue omeprazole 40 mg daily.        Other   Alcohol use disorder, mild, abuse    Stable. I will renew acamprosate 333 mg, 2 tabs three times a day.      Relevant Medications   acamprosate (CAMPRAL) 333 MG tablet   Chronic insomnia    Stable. Continue hydroxyzine and trazodone.      Relevant Medications   hydrOXYzine (VISTARIL) 25 MG capsule   traZODone (DESYREL) 100 MG tablet   Hyperglycemia    Recent blood sugar was elevated. I will repeat a blood sugar and an A1c today to assess for possible diabetes.      Relevant Orders   Glucose, random   Hemoglobin A1c   Seasonal allergies    Stable. I will renew albuterol for PRN use.      Relevant Medications   albuterol (VENTOLIN HFA) 108 (90 Base) MCG/ACT inhaler    Return in about 3 months (around 10/11/2023) for Reassessment.   Loyola Mast, MD

## 2023-07-11 NOTE — Assessment & Plan Note (Signed)
Recent blood sugar was elevated. I will repeat a blood sugar and an A1c today to assess for possible diabetes.

## 2023-07-11 NOTE — Assessment & Plan Note (Signed)
Stable. I will renew albuterol for PRN use.

## 2023-07-11 NOTE — Assessment & Plan Note (Signed)
Stable. I will renew acamprosate 333 mg, 2 tabs three times a day.

## 2023-07-12 LAB — HEMOGLOBIN A1C
Hgb A1c MFr Bld: 6.6 %{Hb} — ABNORMAL HIGH (ref <5.7)
Mean Plasma Glucose: 143 mg/dL
eAG (mmol/L): 7.9 mmol/L

## 2023-07-12 LAB — GLUCOSE, RANDOM: Glucose, Plasma: 111 mg/dL (ref 65–139)

## 2023-07-13 ENCOUNTER — Telehealth: Payer: BC Managed Care – PPO | Admitting: Nurse Practitioner

## 2023-07-13 DIAGNOSIS — H109 Unspecified conjunctivitis: Secondary | ICD-10-CM | POA: Diagnosis not present

## 2023-07-13 MED ORDER — POLYMYXIN B-TRIMETHOPRIM 10000-0.1 UNIT/ML-% OP SOLN: 1.0000 [drp] | OPHTHALMIC | 0 refills | Status: DC

## 2023-07-13 NOTE — Progress Notes (Signed)
Virtual Visit Consent   [name], you are scheduled for a virtual visit with a [facility] provider today. Just as with appointments in the office, your consent must be obtained to participate. Your consent will be active for this visit and any virtual visit you may have with one of our providers in the next 365 days. If you have a MyChart account, a copy of this consent can be sent to you electronically.  As this is a virtual visit, video technology does not allow for your provider to perform a traditional examination. This may limit your provider's ability to fully assess your condition. If your provider identifies any concerns that need to be evaluated in person or the need to arrange testing (such as labs, EKG, etc.), we will make arrangements to do so. Although advances in technology are sophisticated, we cannot ensure that it will always work on either your end or our end. If the connection with a video visit is poor, the visit may have to be switched to a telephone visit. With either a video or telephone visit, we are not always able to ensure that we have a secure connection.  By engaging in this virtual visit, you consent to the provision of healthcare and authorize for your insurance to be billed (if applicable) for the services provided during this visit. Depending on your insurance coverage, you may receive a charge related to this service.  I need to obtain your verbal consent now. Are you willing to proceed with your visit today? Juan Esparza has provided verbal consent on 07/13/2023 for a virtual visit (video or telephone). Claiborne Rigg, NP  Date: 07/13/2023 1:28 PM  Virtual Visit via Video Note   I, Claiborne Rigg, connected with  Juan Esparza  ([id number], 11/30/1980) on 07/13/23 at  1:15 PM EST by a video-enabled telemedicine application and verified that I am speaking with the correct person using two identifiers.  Location: Patient: Virtual Visit Location  Patient: Home Provider: Virtual Visit Location Provider: Home Office   I discussed the limitations of evaluation and management by telemedicine and the availability of in person appointments. The patient expressed understanding and agreed to proceed.    History of Present Illness: Juan Esparza is a 42 y.o. who identifies as a male who was assigned male at birth, and is being seen today for BACTERIAL CONJUNCTIVITIS.  [name] has been experiencing itching, irritation, redness, swelling and matting/crusting of right eye over the past few days   Problems:  Patient Active Problem List   Diagnosis Date Noted   Hyperglycemia 07/11/2023   Erectile dysfunction 06/13/2023   Seasonal allergic rhinitis due to pollen 06/13/2023   Snoring 06/13/2023   Chronic insomnia 12/25/2020   Anxiety 12/25/2020   Seasonal allergies 12/25/2020   Alcohol use disorder, mild, abuse 04/27/2020   GERD (gastroesophageal reflux disease) 11/11/2019   Chronic diarrhea 11/11/2019    Allergies: No Known Allergies Medications:  Current Outpatient Medications:    trimethoprim-polymyxin b (POLYTRIM) ophthalmic solution, Place 1 drop into the right eye every 4 (four) hours., Disp: 10 mL, Rfl: 0   acamprosate (CAMPRAL) 333 MG tablet, Take 2 tablets (666 mg total) by mouth 3 (three) times daily., Disp: 180 tablet, Rfl: 2   albuterol (VENTOLIN HFA) 108 (90 Base) MCG/ACT inhaler, Inhale 2 puffs into the lungs every 6 (six) hours as needed for wheezing or shortness of breath (Cough)., Disp: 18 g, Rfl: 0   benzonatate (TESSALON) 200 MG capsule,  Take 1 capsule (200 mg total) by mouth 3 (three) times daily as needed. (Patient not taking: Reported on 07/11/2023), Disp: 20 capsule, Rfl: 0   fluticasone (FLONASE) 50 MCG/ACT nasal spray, Place 1 spray into both nostrils daily. Begin by using 2 sprays in each nare daily for 3 to 5 days, then decrease to 1 spray in each nare daily., Disp: 15.8 mL, Rfl: 2   hydrOXYzine (VISTARIL) 25  MG capsule, Take 1 capsule (25 mg total) by mouth at bedtime as needed., Disp: 30 capsule, Rfl: 6   Olopatadine HCl (PATADAY) 0.2 % SOLN, Apply 1 drop to eye daily., Disp: 2.5 mL, Rfl: 1   omeprazole (PRILOSEC) 40 MG capsule, Take 1 capsule (40 mg total) by mouth daily., Disp: 90 capsule, Rfl: 3   ondansetron (ZOFRAN-ODT) 4 MG disintegrating tablet, Take 1 tablet (4 mg total) by mouth every 8 (eight) hours as needed for nausea or vomiting., Disp: 10 tablet, Rfl: 0   sildenafil (VIAGRA) 50 MG tablet, Take 1-2 tablets (50-100 mg total) by mouth daily as needed for erectile dysfunction., Disp: 30 tablet, Rfl: 5   tiZANidine (ZANAFLEX) 4 MG tablet, Take 1 tablet (4 mg total) by mouth at bedtime., Disp: 30 tablet, Rfl: 0   traZODone (DESYREL) 100 MG tablet, Take 1 tablet (100 mg total) by mouth at bedtime as needed., Disp: 90 tablet, Rfl: 3  Observations/Objective: Patient is well-developed, well-nourished in no acute distress.  Resting comfortably at home.  Head is normocephalic, atraumatic.  No labored breathing.  Speech is clear and coherent with logical content.  Patient is alert and oriented at baseline.    Assessment and Plan: 1. Bacterial conjunctivitis of right eye - trimethoprim-polymyxin b (POLYTRIM) ophthalmic solution; Place 1 drop into the right eye every 4 (four) hours.  Dispense: 10 mL; Refill: 0 Apply cold compresses to eye for relief of pain  Follow Up Instructions: I discussed the assessment and treatment plan with the patient. The patient was provided an opportunity to ask questions and all were answered. The patient agreed with the plan and demonstrated an understanding of the instructions.  A copy of instructions were sent to the patient via MyChart unless otherwise noted below.    The patient was advised to call back or seek an in-person evaluation if the symptoms worsen or if the condition fails to improve as anticipated.    Claiborne Rigg, NP

## 2023-07-13 NOTE — Patient Instructions (Signed)
Juan Esparza, thank you for joining Claiborne Rigg, NP for today's virtual visit.  While this provider is not your primary care provider (PCP), if your PCP is located in our provider database this encounter information will be shared with them immediately following your visit.   A [facility] MyChart account gives you access to today's visit and all your visits, tests, and labs performed at [facility] " click here if you don't have a [facility] MyChart account or go to [email or web address]  Consent: (Patient) Juan Esparza provided verbal consent for this virtual visit at the beginning of the encounter.  Current Medications:  Current Outpatient Medications:    trimethoprim-polymyxin b (POLYTRIM) ophthalmic solution, Place 1 drop into the right eye every 4 (four) hours., Disp: 10 mL, Rfl: 0   acamprosate (CAMPRAL) 333 MG tablet, Take 2 tablets (666 mg total) by mouth 3 (three) times daily., Disp: 180 tablet, Rfl: 2   albuterol (VENTOLIN HFA) 108 (90 Base) MCG/ACT inhaler, Inhale 2 puffs into the lungs every 6 (six) hours as needed for wheezing or shortness of breath (Cough)., Disp: 18 g, Rfl: 0   benzonatate (TESSALON) 200 MG capsule, Take 1 capsule (200 mg total) by mouth 3 (three) times daily as needed. (Patient not taking: Reported on 07/11/2023), Disp: 20 capsule, Rfl: 0   fluticasone (FLONASE) 50 MCG/ACT nasal spray, Place 1 spray into both nostrils daily. Begin by using 2 sprays in each nare daily for 3 to 5 days, then decrease to 1 spray in each nare daily., Disp: 15.8 mL, Rfl: 2   hydrOXYzine (VISTARIL) 25 MG capsule, Take 1 capsule (25 mg total) by mouth at bedtime as needed., Disp: 30 capsule, Rfl: 6   Olopatadine HCl (PATADAY) 0.2 % SOLN, Apply 1 drop to eye daily., Disp: 2.5 mL, Rfl: 1   omeprazole (PRILOSEC) 40 MG capsule, Take 1 capsule (40 mg total) by mouth daily., Disp: 90 capsule, Rfl: 3   ondansetron (ZOFRAN-ODT) 4 MG disintegrating tablet, Take 1 tablet  (4 mg total) by mouth every 8 (eight) hours as needed for nausea or vomiting., Disp: 10 tablet, Rfl: 0   sildenafil (VIAGRA) 50 MG tablet, Take 1-2 tablets (50-100 mg total) by mouth daily as needed for erectile dysfunction., Disp: 30 tablet, Rfl: 5   tiZANidine (ZANAFLEX) 4 MG tablet, Take 1 tablet (4 mg total) by mouth at bedtime., Disp: 30 tablet, Rfl: 0   traZODone (DESYREL) 100 MG tablet, Take 1 tablet (100 mg total) by mouth at bedtime as needed., Disp: 90 tablet, Rfl: 3   Medications ordered in this encounter:  Meds ordered this encounter  Medications   trimethoprim-polymyxin b (POLYTRIM) ophthalmic solution    Sig: Place 1 drop into the right eye every 4 (four) hours.    Dispense:  10 mL    Refill:  0    Order Specific Question:   Supervising Provider    Answer:   Merrilee Jansky [[id number]]     *If you need refills on other medications prior to your next appointment, please contact your pharmacy*  Follow-Up: Call back or seek an in-person evaluation if the symptoms worsen or if the condition fails to improve as anticipated.  [facility] [phone]  Other Instructions Apply cold compresses to eye for relief of pain   If you have been instructed to have an in-person evaluation today at a local Urgent Care facility, please use the link below. It will take you to a list of  all of our available [facility], including address, phone number and hours of operation. Please do not delay care.  [facility]  If you or a family member do not have a primary care provider, use the link below to schedule a visit and establish care. When you choose a [facility] primary care physician or advanced practice provider, you gain a long-term partner in health. Find a Primary Care Provider  Learn more about [facility]'s in-office and virtual care options: [facility] - Get Care Now

## 2023-07-14 ENCOUNTER — Encounter: Payer: Self-pay | Admitting: Nurse Practitioner

## 2023-07-16 ENCOUNTER — Other Ambulatory Visit (HOSPITAL_COMMUNITY): Payer: Self-pay

## 2023-07-22 ENCOUNTER — Other Ambulatory Visit (HOSPITAL_COMMUNITY): Payer: Self-pay

## 2023-07-28 ENCOUNTER — Ambulatory Visit: Payer: BC Managed Care – PPO | Admitting: Nurse Practitioner

## 2023-08-12 ENCOUNTER — Other Ambulatory Visit (HOSPITAL_COMMUNITY): Payer: Self-pay

## 2023-08-12 ENCOUNTER — Telehealth: Payer: Self-pay

## 2023-08-12 NOTE — Telephone Encounter (Signed)
*  Primary  Pharmacy Patient Advocate Encounter   Received notification from CoverMyMeds that prior authorization for Albuterol Sulfate HFA 108 (90 Base)MCG/ACT aerosol  is required/requested.   Insurance verification completed.   The patient is insured through [employer] .   Per test claim:  Albuterol HFA 8.5gm is preferred by the insurance.  If suggested medication is appropriate, Please send in a new RX and discontinue this one. If not, please advise as to why it's not appropriate so that we may request a Prior Authorization. Please note, some preferred medications may still require a PA

## 2023-09-18 ENCOUNTER — Telehealth: Payer: BC Managed Care – PPO | Admitting: Family Medicine

## 2023-09-18 DIAGNOSIS — J069 Acute upper respiratory infection, unspecified: Secondary | ICD-10-CM | POA: Diagnosis not present

## 2023-09-18 MED ORDER — PSEUDOEPH-BROMPHEN-DM 30-2-10 MG/5ML PO SYRP: 5.0000 mL | ORAL_SOLUTION | Freq: Four times a day (QID) | ORAL | 0 refills | Status: DC | PRN

## 2023-09-18 MED ORDER — BENZONATATE 100 MG PO CAPS: 100.0000 mg | ORAL_CAPSULE | Freq: Three times a day (TID) | ORAL | 0 refills | Status: DC | PRN

## 2023-09-18 MED ORDER — FLUTICASONE PROPIONATE 50 MCG/ACT NA SUSP: 2.0000 | Freq: Every day | NASAL | 0 refills | Status: DC

## 2023-09-18 NOTE — Patient Instructions (Signed)
Juan Esparza, thank you for joining Freddy Finner, NP for today's virtual visit.  While this provider is not your primary care provider (PCP), if your PCP is located in our provider database this encounter information will be shared with them immediately following your visit.   A [facility] MyChart account gives you access to today's visit and all your visits, tests, and labs performed at [facility] " click here if you don't have a [facility] MyChart account or go to [email or web address]  Consent: (Patient) Juan Esparza provided verbal consent for this virtual visit at the beginning of the encounter.  Current Medications:  Current Outpatient Medications:    benzonatate (TESSALON) 100 MG capsule, Take 1 capsule (100 mg total) by mouth 3 (three) times daily as needed for cough., Disp: 30 capsule, Rfl: 0   brompheniramine-pseudoephedrine-DM 30-2-10 MG/5ML syrup, Take 5 mLs by mouth 4 (four) times daily as needed., Disp: 120 mL, Rfl: 0   fluticasone (FLONASE) 50 MCG/ACT nasal spray, Place 2 sprays into both nostrils daily., Disp: 16 g, Rfl: 0   acamprosate (CAMPRAL) 333 MG tablet, Take 2 tablets (666 mg total) by mouth 3 (three) times daily., Disp: 180 tablet, Rfl: 2   albuterol (VENTOLIN HFA) 108 (90 Base) MCG/ACT inhaler, Inhale 2 puffs into the lungs every 6 (six) hours as needed for wheezing or shortness of breath (Cough)., Disp: 18 g, Rfl: 0   fluticasone (FLONASE) 50 MCG/ACT nasal spray, Place 1 spray into both nostrils daily. Begin by using 2 sprays in each nare daily for 3 to 5 days, then decrease to 1 spray in each nare daily., Disp: 15.8 mL, Rfl: 2   hydrOXYzine (VISTARIL) 25 MG capsule, Take 1 capsule (25 mg total) by mouth at bedtime as needed., Disp: 30 capsule, Rfl: 6   Olopatadine HCl (PATADAY) 0.2 % SOLN, Apply 1 drop to eye daily., Disp: 2.5 mL, Rfl: 1   omeprazole (PRILOSEC) 40 MG capsule, Take 1 capsule (40 mg total) by mouth daily., Disp: 90 capsule,  Rfl: 3   ondansetron (ZOFRAN-ODT) 4 MG disintegrating tablet, Take 1 tablet (4 mg total) by mouth every 8 (eight) hours as needed for nausea or vomiting., Disp: 10 tablet, Rfl: 0   sildenafil (VIAGRA) 50 MG tablet, Take 1-2 tablets (50-100 mg total) by mouth daily as needed for erectile dysfunction., Disp: 30 tablet, Rfl: 5   tiZANidine (ZANAFLEX) 4 MG tablet, Take 1 tablet (4 mg total) by mouth at bedtime., Disp: 30 tablet, Rfl: 0   traZODone (DESYREL) 100 MG tablet, Take 1 tablet (100 mg total) by mouth at bedtime as needed., Disp: 90 tablet, Rfl: 3   trimethoprim-polymyxin b (POLYTRIM) ophthalmic solution, Place 1 drop into the right eye every 4 (four) hours., Disp: 10 mL, Rfl: 0   Medications ordered in this encounter:  Meds ordered this encounter  Medications   brompheniramine-pseudoephedrine-DM 30-2-10 MG/5ML syrup    Sig: Take 5 mLs by mouth 4 (four) times daily as needed.    Dispense:  120 mL    Refill:  0    Supervising Provider:   Merrilee Jansky [[id number]]   benzonatate (TESSALON) 100 MG capsule    Sig: Take 1 capsule (100 mg total) by mouth 3 (three) times daily as needed for cough.    Dispense:  30 capsule    Refill:  0    Supervising Provider:   Merrilee Jansky [[id number]]   fluticasone (FLONASE) 50 MCG/ACT nasal spray    Sig: Place 2  sprays into both nostrils daily.    Dispense:  16 g    Refill:  0    Supervising Provider:   Merrilee Jansky [[id number]]     *If you need refills on other medications prior to your next appointment, please contact your pharmacy*  Follow-Up: Call back or seek an in-person evaluation if the symptoms worsen or if the condition fails to improve as anticipated.  [facility] [phone]  Other Instructions URI recommendations: - Increased rest - Increasing Fluids - Acetaminophen / ibuprofen as needed for fever/pain.  - Salt water gargling, chloraseptic spray and throat lozenges - Mucinex if mucus is present and  increasing.  - Saline nasal spray if congestion or if nasal passages feel dry. - Humidifying the air.     If you have been instructed to have an in-person evaluation today at a local Urgent Care facility, please use the link below. It will take you to a list of all of our available [facility], including address, phone number and hours of operation. Please do not delay care.  [facility]  If you or a family member do not have a primary care provider, use the link below to schedule a visit and establish care. When you choose a [facility] primary care physician or advanced practice provider, you gain a long-term partner in health. Find a Primary Care Provider  Learn more about [facility]'s in-office and virtual care options:  - Get Care Now

## 2023-09-18 NOTE — Progress Notes (Signed)
Virtual Visit Consent   Juan Esparza, you are scheduled for a virtual visit with a [facility] provider today. Just as with appointments in the office, your consent must be obtained to participate. Your consent will be active for this visit and any virtual visit you may have with one of our providers in the next 365 days. If you have a MyChart account, a copy of this consent can be sent to you electronically.  As this is a virtual visit, video technology does not allow for your provider to perform a traditional examination. This may limit your provider's ability to fully assess your condition. If your provider identifies any concerns that need to be evaluated in person or the need to arrange testing (such as labs, EKG, etc.), we will make arrangements to do so. Although advances in technology are sophisticated, we cannot ensure that it will always work on either your end or our end. If the connection with a video visit is poor, the visit may have to be switched to a telephone visit. With either a video or telephone visit, we are not always able to ensure that we have a secure connection.  By engaging in this virtual visit, you consent to the provision of healthcare and authorize for your insurance to be billed (if applicable) for the services provided during this visit. Depending on your insurance coverage, you may receive a charge related to this service.  I need to obtain your verbal consent now. Are you willing to proceed with your visit today? Juan Esparza has provided verbal consent on 09/18/2023 for a virtual visit (video or telephone). Freddy Finner, NP  Date: 09/18/2023 3:01 PM  Virtual Visit via Video Note   I, Freddy Finner, connected with  Juan Esparza  ([id number], 10/29/1980) on 09/18/23 at  3:00 PM EST by a video-enabled telemedicine application and verified that I am speaking with the correct person using two identifiers.  Location: Patient: Virtual Visit Location Patient:  Home Provider: Virtual Visit Location Provider: Home Office   I discussed the limitations of evaluation and management by telemedicine and the availability of in person appointments. The patient expressed understanding and agreed to proceed.    History of Present Illness: Juan Esparza is a 43 y.o. who identifies as a male who was assigned male at birth, and is being seen today for URI  Onset was Monday started feeling poorly, Tuesday he got worse - aching vomiting and feeling poorly Associated symptoms are chills, cough- some mucus, congestion, body aches Modifying factors are tylenol, ny quil  Denies chest pain, shortness of breath, fevers  Exposure to sick contacts- known- wife recently sick with URI COVID test: no  Vaccines: not UTD     Problems:  Patient Active Problem List   Diagnosis Date Noted   Hyperglycemia 07/11/2023   Erectile dysfunction 06/13/2023   Seasonal allergic rhinitis due to pollen 06/13/2023   Snoring 06/13/2023   Chronic insomnia 12/25/2020   Anxiety 12/25/2020   Seasonal allergies 12/25/2020   Alcohol use disorder, mild, abuse 04/27/2020   GERD (gastroesophageal reflux disease) 11/11/2019   Chronic diarrhea 11/11/2019    Allergies: No Known Allergies Medications:  Current Outpatient Medications:    acamprosate (CAMPRAL) 333 MG tablet, Take 2 tablets (666 mg total) by mouth 3 (three) times daily., Disp: 180 tablet, Rfl: 2   albuterol (VENTOLIN HFA) 108 (90 Base) MCG/ACT inhaler, Inhale 2 puffs into the lungs every 6 (six) hours as needed for wheezing  or shortness of breath (Cough)., Disp: 18 g, Rfl: 0   benzonatate (TESSALON) 200 MG capsule, Take 1 capsule (200 mg total) by mouth 3 (three) times daily as needed. (Patient not taking: Reported on 07/11/2023), Disp: 20 capsule, Rfl: 0   fluticasone (FLONASE) 50 MCG/ACT nasal spray, Place 1 spray into both nostrils daily. Begin by using 2 sprays in each nare daily for 3 to 5 days, then decrease to 1 spray  in each nare daily., Disp: 15.8 mL, Rfl: 2   hydrOXYzine (VISTARIL) 25 MG capsule, Take 1 capsule (25 mg total) by mouth at bedtime as needed., Disp: 30 capsule, Rfl: 6   Olopatadine HCl (PATADAY) 0.2 % SOLN, Apply 1 drop to eye daily., Disp: 2.5 mL, Rfl: 1   omeprazole (PRILOSEC) 40 MG capsule, Take 1 capsule (40 mg total) by mouth daily., Disp: 90 capsule, Rfl: 3   ondansetron (ZOFRAN-ODT) 4 MG disintegrating tablet, Take 1 tablet (4 mg total) by mouth every 8 (eight) hours as needed for nausea or vomiting., Disp: 10 tablet, Rfl: 0   sildenafil (VIAGRA) 50 MG tablet, Take 1-2 tablets (50-100 mg total) by mouth daily as needed for erectile dysfunction., Disp: 30 tablet, Rfl: 5   tiZANidine (ZANAFLEX) 4 MG tablet, Take 1 tablet (4 mg total) by mouth at bedtime., Disp: 30 tablet, Rfl: 0   traZODone (DESYREL) 100 MG tablet, Take 1 tablet (100 mg total) by mouth at bedtime as needed., Disp: 90 tablet, Rfl: 3   trimethoprim-polymyxin b (POLYTRIM) ophthalmic solution, Place 1 drop into the right eye every 4 (four) hours., Disp: 10 mL, Rfl: 0  Observations/Objective: Patient is well-developed, well-nourished in no acute distress.  Resting comfortably  at home.  Head is normocephalic, atraumatic.  No labored breathing.  Speech is clear and coherent with logical content.  Patient is alert and oriented at baseline.    Assessment and Plan:   1. Viral URI with cough (Primary)  - brompheniramine-pseudoephedrine-DM 30-2-10 MG/5ML syrup; Take 5 mLs by mouth 4 (four) times daily as needed.  Dispense: 120 mL; Refill: 0 - benzonatate (TESSALON) 100 MG capsule; Take 1 capsule (100 mg total) by mouth 3 (three) times daily as needed for cough.  Dispense: 30 capsule; Refill: 0 - fluticasone (FLONASE) 50 MCG/ACT nasal spray; Place 2 sprays into both nostrils daily.  Dispense: 16 g; Refill: 0  - Increased rest - Increasing Fluids - Acetaminophen / ibuprofen as needed for fever/pain.  - Salt water gargling,  chloraseptic spray and throat lozenges - Mucinex if mucus is present and increasing.  - Saline nasal spray if congestion or if nasal passages feel dry. - Humidifying the air.   Reviewed side effects, risks and benefits of medication.    Patient acknowledged agreement and understanding of the plan.   Past Medical, Surgical, Social History, Allergies, and Medications have been Reviewed.    Follow Up Instructions: I discussed the assessment and treatment plan with the patient. The patient was provided an opportunity to ask questions and all were answered. The patient agreed with the plan and demonstrated an understanding of the instructions.  A copy of instructions were sent to the patient via MyChart unless otherwise noted below.    The patient was advised to call back or seek an in-person evaluation if the symptoms worsen or if the condition fails to improve as anticipated.    Freddy Finner, NP

## 2023-10-02 ENCOUNTER — Other Ambulatory Visit (HOSPITAL_COMMUNITY): Payer: Self-pay

## 2023-10-03 ENCOUNTER — Other Ambulatory Visit (HOSPITAL_COMMUNITY): Payer: Self-pay

## 2023-10-10 ENCOUNTER — Ambulatory Visit: Payer: BC Managed Care – PPO | Admitting: Family Medicine

## 2023-10-14 ENCOUNTER — Telehealth: Payer: Self-pay | Admitting: Family Medicine

## 2023-10-14 NOTE — Telephone Encounter (Signed)
11/20/2020 same day cancellation 02/12/2021 no show 05/25/2021 no show 06/15/2021 no show 08/17/2021 no show 03/14/2022 no show 02/19/2023 no show 10/10/2023 no show  Pt has missed 8 visits and has completed 5 visits. Pt is rescheduled for 11/14/2023.   Sending final warning letter via mail and MyChart as only 2 missed visits within the past 12 months.

## 2023-10-16 ENCOUNTER — Other Ambulatory Visit: Payer: BC Managed Care – PPO

## 2023-10-16 ENCOUNTER — Ambulatory Visit: Payer: BC Managed Care – PPO | Admitting: Nurse Practitioner

## 2023-10-16 ENCOUNTER — Encounter: Payer: Self-pay | Admitting: Nurse Practitioner

## 2023-10-16 VITALS — BP 116/78 | HR 84 | Ht 66.0 in | Wt 154.4 lb

## 2023-10-16 DIAGNOSIS — K219 Gastro-esophageal reflux disease without esophagitis: Secondary | ICD-10-CM | POA: Diagnosis not present

## 2023-10-16 DIAGNOSIS — F1010 Alcohol abuse, uncomplicated: Secondary | ICD-10-CM | POA: Diagnosis not present

## 2023-10-16 DIAGNOSIS — K529 Noninfective gastroenteritis and colitis, unspecified: Secondary | ICD-10-CM

## 2023-10-16 DIAGNOSIS — R112 Nausea with vomiting, unspecified: Secondary | ICD-10-CM | POA: Diagnosis not present

## 2023-10-16 LAB — CBC WITH DIFFERENTIAL/PLATELET
Basophils Absolute: 0.1 10*3/uL (ref 0.0–0.1)
Basophils Relative: 1 % (ref 0.0–3.0)
Eosinophils Absolute: 0.1 10*3/uL (ref 0.0–0.7)
Eosinophils Relative: 1.7 % (ref 0.0–5.0)
HCT: 37.8 % — ABNORMAL LOW (ref 39.0–52.0)
Hemoglobin: 12.3 g/dL — ABNORMAL LOW (ref 13.0–17.0)
Lymphocytes Relative: 30.7 % (ref 12.0–46.0)
Lymphs Abs: 2.3 10*3/uL (ref 0.7–4.0)
MCHC: 32.5 g/dL (ref 30.0–36.0)
MCV: 85.9 fL (ref 78.0–100.0)
Monocytes Absolute: 0.5 10*3/uL (ref 0.1–1.0)
Monocytes Relative: 6.8 % (ref 3.0–12.0)
Neutro Abs: 4.5 10*3/uL (ref 1.4–7.7)
Neutrophils Relative %: 59.8 % (ref 43.0–77.0)
Platelets: 282 10*3/uL (ref 150.0–400.0)
RBC: 4.4 Mil/uL (ref 4.22–5.81)
RDW: 13.8 % (ref 11.5–15.5)
WBC: 7.5 10*3/uL (ref 4.0–10.5)

## 2023-10-16 LAB — COMPREHENSIVE METABOLIC PANEL
ALT: 20 U/L (ref 0–53)
AST: 19 U/L (ref 0–37)
Albumin: 4.6 g/dL (ref 3.5–5.2)
Alkaline Phosphatase: 54 U/L (ref 39–117)
BUN: 13 mg/dL (ref 6–23)
CO2: 25 meq/L (ref 19–32)
Calcium: 8.9 mg/dL (ref 8.4–10.5)
Chloride: 104 meq/L (ref 96–112)
Creatinine, Ser: 0.97 mg/dL (ref 0.40–1.50)
GFR: 96.49 mL/min (ref >60.00)
Glucose, Bld: 114 mg/dL — ABNORMAL HIGH (ref 70–99)
Potassium: 3.6 meq/L (ref 3.5–5.1)
Sodium: 139 meq/L (ref 135–145)
Total Bilirubin: 0.8 mg/dL (ref 0.2–1.2)
Total Protein: 7.3 g/dL (ref 6.0–8.3)

## 2023-10-16 LAB — TSH: TSH: 0.97 u[IU]/mL (ref 0.35–5.50)

## 2023-10-16 LAB — C-REACTIVE PROTEIN: CRP: <1 mg/dL (ref 0.5–20.0)

## 2023-10-16 MED ORDER — ONDANSETRON 4 MG PO TBDP: 4.0000 mg | ORAL_TABLET | Freq: Three times a day (TID) | ORAL | 0 refills | Status: DC | PRN

## 2023-10-16 MED ORDER — SUTAB 1479-225-188 MG PO TABS: ORAL_TABLET | ORAL | 0 refills | Status: DC

## 2023-10-16 MED ORDER — SUFLAVE 178.7 G PO SOLR: 1.0000 | Freq: Once | ORAL | 0 refills | Status: AC

## 2023-10-16 NOTE — Progress Notes (Addendum)
10/16/2023 JOSHIA KITCHINGS [id number] Aug 07, 1981   CHIEF COMPLAINT: Nausea, vomiting and chronic diarrhea   HISTORY OF PRESENT ILLNESS: Kermitt Harjo. Daponte is a 43 year old male with a past medical history of anxiety, depression, alcohol use disorder, possible sleep apnea, GERD and chronic diarrhea.  He presents to our office today as referred by Herbie Drape for further evaluation regarding nausea, vomiting and epigastric pain. He is accompanied by his wife. He endorses having N/V and chronic diarrhea x 4 years. He vomits in the morning before going to work and sometimes vomits if he eats something that doesn't settle well in his stomach which occurs 2 to 3 times weekly. Emesis described as yellow liquid. No coffee ground or frank hematemesis but on a few occasions noted seeing a few specs of light red blood. He passes 3 to 4 nonbloody diarrhea bowel movements on a good day and 5 to 9 episodes on a bad day. No significant abdominal pain but has mild upper discomfort if he vomits and mild lower abdominal discomfort if passes a lot of diarrhea. He does not take Imodium or Pepto bismol.  He was recently prescribed an antibiotic due to having URI symptoms, his diarrhea did not improve or worsen after taking the antibiotic.  He cannot recall the last time he passed a solid bowel movement.  He has heartburn if he eats spaghetti or acidic foods. He takes Omeprazole 40mg  every day x 4 years. No dysphagia. No bloody or black stools.  No NSAIDS. No weight loss. Sometimes wakes up with sweats which occurs 2 to 3 nights weekly x 6 months.  He stated drinking a lot of beer for many years in the past but reduce his beer intake to three 23 oz white claw beers daily for the past 3 years.  He drinks 1 or 2 Pepsi Colas or ginger ale daily. No drug use.  No known family history of IBD or colorectal cancer.  He denies ever having an EGD or colonoscopy.  RUQ sonogram 06/2023 showed a normal liver and gallbladder.      Latest Ref Rng & Units 06/13/2023    4:28 PM 12/29/2017    6:45 PM 04/28/2017    2:50 PM  CBC  WBC 3.4 - 10.8 x10E3/uL 7.7  6.5  7.4   Hemoglobin 13.0 - 17.7 g/dL 78.2  95.6  21.3   Hematocrit 37.5 - 51.0 % 43.7  42.6  44.9   Platelets 150 - 450 x10E3/uL 298  231  273        Latest Ref Rng & Units 06/13/2023    4:28 PM 12/29/2017    6:45 PM 04/28/2017    2:50 PM  CMP  Glucose 70 - 99 mg/dL 086  578  94   BUN 6 - 24 mg/dL 15  11  20    Creatinine 0.76 - 1.27 mg/dL 4.69  6.29  5.28   Sodium 134 - 144 mmol/L 143  138  139   Potassium 3.5 - 5.2 mmol/L 3.8  3.9  4.7   Chloride 96 - 106 mmol/L 105  103  100   CO2 20 - 29 mmol/L 19  24  24    Calcium 8.7 - 10.2 mg/dL 9.2  9.0  41.3   Total Protein 6.0 - 8.5 g/dL 6.8  6.7  7.8   Total Bilirubin 0.0 - 1.2 mg/dL 0.4  0.9  0.6   Alkaline Phos 44 - 121 IU/L 64  47  45   AST  0 - 40 IU/L 16  34  25   ALT 0 - 44 IU/L 21  29  21      RUQ sono 06/24/2023:  Gallbladder: No gallstones or wall thickening visualized. No sonographic Murphy sign noted by sonographer.   Common bile duct: Diameter: 3 mm   Liver: No focal lesion identified. Within normal limits in parenchymal echogenicity. Portal vein is patent on color Doppler imaging with normal direction of blood flow towards the liver   IMPRESSION: No cholelithiasis or sonographic evidence for acute cholecystitis.    Past Medical History:  Diagnosis Date   GERD (gastroesophageal reflux disease)    Sleep apnea    No past surgical history on file.  Social History: He is married. Past smoker, quit smoking cigarettes 4 years ago. He drinks three 23 oz beers daily, cut back 3 years ago, prior to that "drank a lot more". No drug use.   Family History: Father with alcohol use disorder and drug abuse.  Mother with depression, asthma and CVA. Paternal grandfather died from lung cancer.   No Known Allergies    Outpatient Encounter Medications as of 10/16/2023  Medication Sig   acamprosate  (CAMPRAL) 333 MG tablet Take 2 tablets (666 mg total) by mouth 3 (three) times daily.   albuterol (VENTOLIN HFA) 108 (90 Base) MCG/ACT inhaler Inhale 2 puffs into the lungs every 6 (six) hours as needed for wheezing or shortness of breath (Cough).   benzonatate (TESSALON) 100 MG capsule Take 1 capsule (100 mg total) by mouth 3 (three) times daily as needed for cough.   brompheniramine-pseudoephedrine-DM 30-2-10 MG/5ML syrup Take 5 mLs by mouth 4 (four) times daily as needed.   fluticasone (FLONASE) 50 MCG/ACT nasal spray Place 1 spray into both nostrils daily. Begin by using 2 sprays in each nare daily for 3 to 5 days, then decrease to 1 spray in each nare daily.   fluticasone (FLONASE) 50 MCG/ACT nasal spray Place 2 sprays into both nostrils daily.   hydrOXYzine (VISTARIL) 25 MG capsule Take 1 capsule (25 mg total) by mouth at bedtime as needed.   Olopatadine HCl (PATADAY) 0.2 % SOLN Apply 1 drop to eye daily.   omeprazole (PRILOSEC) 40 MG capsule Take 1 capsule (40 mg total) by mouth daily.   ondansetron (ZOFRAN-ODT) 4 MG disintegrating tablet Take 1 tablet (4 mg total) by mouth every 8 (eight) hours as needed for nausea or vomiting.   sildenafil (VIAGRA) 50 MG tablet Take 1-2 tablets (50-100 mg total) by mouth daily as needed for erectile dysfunction.   tiZANidine (ZANAFLEX) 4 MG tablet Take 1 tablet (4 mg total) by mouth at bedtime.   traZODone (DESYREL) 100 MG tablet Take 1 tablet (100 mg total) by mouth at bedtime as needed.   trimethoprim-polymyxin b (POLYTRIM) ophthalmic solution Place 1 drop into the right eye every 4 (four) hours.   No facility-administered encounter medications on file as of 10/16/2023.   REVIEW OF SYSTEMS:  Gen: See HPI. CV: Denies chest pain, palpitations or edema. Resp: Denies cough, shortness of breath of hemoptysis.  GI: HPI.  GU: Denies urinary burning, blood in urine, increased urinary frequency or incontinence. MS: Denies joint pain, muscles aches or  weakness. Derm: Denies rash, itchiness, skin lesions or unhealing ulcers. Psych: Denies depression, anxiety, memory loss or confusion. Heme: Denies bruising, easy bleeding. Neuro:  Denies headaches, dizziness or paresthesias. Endo:  Denies any problems with DM, thyroid or adrenal function.  PHYSICAL EXAM: BP 116/78 (BP Location: Left Arm,  Patient Position: Sitting, Cuff Size: Normal)   Pulse 84   Ht 5\' 6"  (1.676 m) Comment: height measured without shoes  Wt 154 lb 6 oz (70 kg)   BMI 24.92 kg/m   Wt Readings from Last 3 Encounters:  10/16/23 154 lb 6 oz (70 kg)  07/11/23 155 lb (70.3 kg)  06/13/23 151 lb 9.6 oz (68.8 kg)    General: 43 year old male in no acute distress. Head: Normocephalic and atraumatic. Eyes:  Sclerae non-icteric, conjunctive pink. Ears: Normal auditory acuity. Mouth: Dentition intact. No ulcers or lesions.  Neck: Supple, no lymphadenopathy or thyromegaly.  Lungs: Clear bilaterally to auscultation without wheezes, crackles or rhonchi. Heart: Regular rate and rhythm. No murmur, rub or gallop appreciated.  Abdomen: Soft, protuberant.  Nontender, nondistended. No masses. No hepatosplenomegaly. Normoactive bowel sounds x 4 quadrants.  Rectal: Deferred. Musculoskeletal: Symmetrical with no gross deformities. Skin: Warm and dry. No rash or lesions on visible extremities. Extremities: No edema. Neurological: Alert oriented x 4, no focal deficits.  Psychological:  Alert and cooperative. Normal mood and affect.  ASSESSMENT AND PLAN:  N/V, occurs 2 to 3 times weekly x 4 years.  Normal LFTs and lipase level.  RUQ sonogram 06/2023 showed a normal gallbladder and liver.  Alcohol use likely a contributing factor. -EGD benefits and risks discussed including risk with sedation, risk of bleeding, perforation and infection  -Continue Omeprazole 40 mg once daily -Ondansetron ODT 4 mg tab 1 tab dissolve on tongue every 8 hours as needed -Patient to contact office if symptoms  worsen  GERD. On Omeprazole 40 mg daily x 4 years.  -See plan as ordered above  -GERD diet  Chronic diarrhea, alcohol use likely a contributing factor. -Diagnostic colonoscopy to rule out microscopic colitis/IBD benefits and risks discussed including risk with sedation, risk of bleeding, perforation and infection  -CBC, CMP, CRP, TSH -If diagnostic colonoscopy unrevealing, will consider stopping PPI and switching to H2 blocker -Benefiber 1 tablespoon daily to bulk up stool as tolerated -Imodium 1 tab daily, hold if no BM in 24 hours  Alcohol use disorder -Patient counseled to reduce alcohol intake with the goal of complete alcohol cessation    CC:  Rudd, Bertram Millard, MD   I have reviewed the clinic note as outlined by Alcide Evener, NP and agree with the assessment, plan and medical decision making. Mr. Denardo is a 43 year old gentleman who is seen in consultation regarding symptoms of GERD, nausea, vomiting, epigastric abdominal pain and chronic diarrhea.  Symptoms have persisted for 4 years.  Emesis can occur provoked or unprovoked-nonbloody, nonbilious.  He is on a PPI.  Also has watery, nonbloody diarrhea-reported never to have a solid stool.  I agree with proceeding with diagnostic upper endoscopy and colonoscopy.  Reasonable to perform biopsies for H. pylori, microscopic colitis, rule out IBD.    Maren Beach, MD

## 2023-10-16 NOTE — Patient Instructions (Addendum)
You have been scheduled for an endoscopy and colonoscopy. Please follow the written instructions given to you at your visit today.  If you use inhalers (even only as needed), please bring them with you on the day of your procedure. ____________________________________________  Juan Esparza will receive your bowel preparation through [facility], which ensures the lowest copay and home delivery, with outreach via text or call from an [phone] number. Please respond promptly to avoid rescheduling of your procedure. If you are interested in alternative options or have any questions regarding your prep, please contact them at [phone] _____________________________________________________  Your Provider Has Sent Your Bowel Prep Regimen To [facility]   [facility] will contact you to verify your information and collect your copay, if applicable. Enjoy the comfort of your home while your prescription is mailed to you, FREE of any shipping charges.   [facility] accepts all major insurance benefits and applies discounts & coupons.  Have additional questions?   Chat: [email or web address] Call: [phone] Email: [email or web address] NCPDP: [id number]  How will [facility] contact you?  With a Welcome phone call,  a Welcome text and a checkout link in text form.  Texts you receive from [phone] Are NOT Spam.  *To set up delivery, you must complete the checkout process via link or speak to one of the patient care representatives. If [facility] is unable to reach you, your prescription may be delayed.  To avoid long hold times on the phone, you may also utilize the secure chat feature on the [facility] website to request that they call you back for transaction completion or to expedite your concerns.  Your provider has requested that you go to the basement level for lab work before leaving today. Press "B" on the elevator. The lab is located at the first door on the left as you exit the  elevator.  Benefiber- take 1 tablespoon daily  Imodium- take 1 tablet by mouth daily, hold if no bowel movement within 24 hours   Reduce alcohol intake with goal of no alcohol.  Due to recent changes in healthcare laws, you may see the results of your imaging and laboratory studies on MyChart before your provider has had a chance to review them.  We understand that in some cases there may be results that are confusing or concerning to you. Not all laboratory results come back in the same time frame and the provider may be waiting for multiple results in order to interpret others.  Please give [place] 48 hours in order for your provider to thoroughly review all the results before contacting the office for clarification of your results.   Thank you for trusting me with your gastrointestinal care!   Juan Esparza, CRNP

## 2023-10-22 ENCOUNTER — Other Ambulatory Visit (HOSPITAL_COMMUNITY): Payer: Self-pay

## 2023-10-26 ENCOUNTER — Encounter (HOSPITAL_COMMUNITY): Payer: Self-pay | Admitting: Emergency Medicine

## 2023-10-26 ENCOUNTER — Other Ambulatory Visit: Payer: Self-pay

## 2023-10-26 ENCOUNTER — Emergency Department (HOSPITAL_COMMUNITY)
Admission: EM | Admit: 2023-10-26 | Discharge: 2023-10-26 | Disposition: A | Discharge status: Home / Self Care | Payer: BC Managed Care – PPO | Attending: Emergency Medicine | Admitting: Emergency Medicine

## 2023-10-26 ENCOUNTER — Emergency Department (HOSPITAL_COMMUNITY): Payer: BC Managed Care – PPO

## 2023-10-26 DIAGNOSIS — R000 Tachycardia, unspecified: Secondary | ICD-10-CM | POA: Diagnosis not present

## 2023-10-26 DIAGNOSIS — D72829 Elevated white blood cell count, unspecified: Secondary | ICD-10-CM | POA: Insufficient documentation

## 2023-10-26 DIAGNOSIS — R112 Nausea with vomiting, unspecified: Secondary | ICD-10-CM | POA: Diagnosis not present

## 2023-10-26 DIAGNOSIS — R109 Unspecified abdominal pain: Secondary | ICD-10-CM | POA: Diagnosis present

## 2023-10-26 DIAGNOSIS — R1084 Generalized abdominal pain: Secondary | ICD-10-CM | POA: Diagnosis not present

## 2023-10-26 DIAGNOSIS — E860 Dehydration: Secondary | ICD-10-CM | POA: Diagnosis not present

## 2023-10-26 DIAGNOSIS — K529 Noninfective gastroenteritis and colitis, unspecified: Secondary | ICD-10-CM | POA: Diagnosis not present

## 2023-10-26 LAB — COMPREHENSIVE METABOLIC PANEL
ALT: 31 U/L (ref 0–44)
AST: 27 U/L (ref 15–41)
Albumin: 5.1 g/dL — ABNORMAL HIGH (ref 3.5–5.0)
Alkaline Phosphatase: 65 U/L (ref 38–126)
Anion gap: 15 (ref 5–15)
BUN: 17 mg/dL (ref 6–20)
CO2: 22 mmol/L (ref 22–32)
Calcium: 10.1 mg/dL (ref 8.9–10.3)
Chloride: 99 mmol/L (ref 98–111)
Creatinine, Ser: 0.96 mg/dL (ref 0.61–1.24)
GFR, Estimated: >60 mL/min (ref >60)
Glucose, Bld: 157 mg/dL — ABNORMAL HIGH (ref 70–99)
Potassium: 3.6 mmol/L (ref 3.5–5.1)
Sodium: 136 mmol/L (ref 135–145)
Total Bilirubin: 1.4 mg/dL — ABNORMAL HIGH (ref 0.0–1.2)
Total Protein: 9.4 g/dL — ABNORMAL HIGH (ref 6.5–8.1)

## 2023-10-26 LAB — CBC
HCT: 46.6 % (ref 39.0–52.0)
Hemoglobin: 15 g/dL (ref 13.0–17.0)
MCH: 27.4 pg (ref 26.0–34.0)
MCHC: 32.2 g/dL (ref 30.0–36.0)
MCV: 85.2 fL (ref 80.0–100.0)
Platelets: 286 10*3/uL (ref 150–400)
RBC: 5.47 MIL/uL (ref 4.22–5.81)
RDW: 13.3 % (ref 11.5–15.5)
WBC: 11.8 10*3/uL — ABNORMAL HIGH (ref 4.0–10.5)
nRBC: 0 % (ref 0.0–0.2)

## 2023-10-26 LAB — URINALYSIS, ROUTINE W REFLEX MICROSCOPIC
Bacteria, UA: NONE SEEN
Bilirubin Urine: NEGATIVE
Glucose, UA: NEGATIVE mg/dL
Hgb urine dipstick: NEGATIVE
Ketones, ur: NEGATIVE mg/dL
Leukocytes,Ua: NEGATIVE
Nitrite: NEGATIVE
Protein, ur: 100 mg/dL — AB
Specific Gravity, Urine: 1.039 — ABNORMAL HIGH (ref 1.005–1.030)
pH: 5 (ref 5.0–8.0)

## 2023-10-26 LAB — RESP PANEL BY RT-PCR (RSV, FLU A&B, COVID)  RVPGX2
Influenza A by PCR: NEGATIVE
Influenza B by PCR: NEGATIVE
Resp Syncytial Virus by PCR: NEGATIVE
SARS Coronavirus 2 by RT PCR: NEGATIVE

## 2023-10-26 LAB — C DIFFICILE QUICK SCREEN W PCR REFLEX
C Diff antigen: NEGATIVE
C Diff interpretation: NOT DETECTED
C Diff toxin: NEGATIVE

## 2023-10-26 LAB — LIPASE, BLOOD: Lipase: 34 U/L (ref 11–51)

## 2023-10-26 MED ORDER — ONDANSETRON HCL 4 MG/2ML IJ SOLN
4.0000 mg | Freq: Once | INTRAMUSCULAR | Status: AC
Start: 2023-10-26 — End: 2023-10-26
  Administered 2023-10-26: 4 mg via INTRAVENOUS
  Filled 2023-10-26: qty 2

## 2023-10-26 MED ORDER — LACTATED RINGERS IV BOLUS
1000.0000 mL | Freq: Once | INTRAVENOUS | Status: AC
  Administered 2023-10-26: 1000 mL via INTRAVENOUS

## 2023-10-26 MED ORDER — MORPHINE SULFATE (PF) 2 MG/ML IV SOLN
2.0000 mg | Freq: Once | INTRAVENOUS | Status: AC
  Administered 2023-10-26: 2 mg via INTRAVENOUS
  Filled 2023-10-26: qty 1

## 2023-10-26 MED ORDER — IOHEXOL 300 MG/ML  SOLN
100.0000 mL | Freq: Once | INTRAMUSCULAR | Status: AC | PRN
  Administered 2023-10-26: 100 mL via INTRAVENOUS

## 2023-10-26 MED ORDER — ACETAMINOPHEN 325 MG PO TABS
975.0000 mg | ORAL_TABLET | Freq: Once | ORAL | Status: AC
  Administered 2023-10-26: 975 mg via ORAL
  Filled 2023-10-26: qty 3

## 2023-10-26 MED ORDER — SODIUM CHLORIDE 0.9 % IV BOLUS
500.0000 mL | Freq: Once | INTRAVENOUS | Status: AC
  Administered 2023-10-26: 500 mL via INTRAVENOUS

## 2023-10-26 NOTE — ED Triage Notes (Signed)
 Pt has been experiencing nausea, vomiting, and diarrhea for a week but this morning it became more frequent.

## 2023-10-26 NOTE — ED Notes (Signed)
 Patient transported to CT

## 2023-10-26 NOTE — ED Provider Notes (Signed)
 [place] EMERGENCY DEPARTMENT AT [facility] Provider Note   CSN: [id number] Arrival date & time: 10/26/23  1309     History  Chief Complaint  Patient presents with   Nausea   Emesis   Abdominal Pain    Juan Esparza is a 43 y.o. male with past medical history of alcohol abuse, GERD, chronic diarrhea presents to emergency department for evaluation of vomiting and diarrhea that have worsened over the past 24 hours.  He reports that he normally vomits and has diarrhea 3-4 times a day for past four years and is followed by GI for this.  However, within the last 24 hours he has been having vomiting and diarrhea about 15-20 times each.  He endorses that diarrhea is watery and green in color.  He also endorses myalgia and chills.  He denies known sick contacts, wife at bedside does not have any similar symptoms. He drinks three 23oz white claws nightly.  Was recently seen by [facility] for initial consult on 10/16/23. He has a follow-up appointment with [facility] April for for endoscopy and colonoscopy   Emesis Associated symptoms: abdominal pain   Abdominal Pain Associated symptoms: vomiting        Home Medications Prior to Admission medications   Medication Sig Start Date End Date Taking? Authorizing Provider  acamprosate (CAMPRAL) 333 MG tablet Take 2 tablets (666 mg total) by mouth 3 (three) times daily. 07/11/23   Loyola Mast, MD  albuterol (VENTOLIN HFA) 108 (90 Base) MCG/ACT inhaler Inhale 2 puffs into the lungs every 6 (six) hours as needed for wheezing or shortness of breath (Cough). 07/11/23   Loyola Mast, MD  benzonatate (TESSALON) 100 MG capsule Take 1 capsule (100 mg total) by mouth 3 (three) times daily as needed for cough. 09/18/23   Freddy Finner, NP  brompheniramine-pseudoephedrine-DM 30-2-10 MG/5ML syrup Take 5 mLs by mouth 4 (four) times daily as needed. 09/18/23   Freddy Finner, NP  fluticasone (FLONASE) 50 MCG/ACT nasal spray Place 1 spray  into both nostrils daily. Begin by using 2 sprays in each nare daily for 3 to 5 days, then decrease to 1 spray in each nare daily. 06/13/23   Loyola Mast, MD  fluticasone (FLONASE) 50 MCG/ACT nasal spray Place 2 sprays into both nostrils daily. 09/18/23   Freddy Finner, NP  hydrOXYzine (VISTARIL) 25 MG capsule Take 1 capsule (25 mg total) by mouth at bedtime as needed. 07/11/23   Loyola Mast, MD  Olopatadine HCl (PATADAY) 0.2 % SOLN Apply 1 drop to eye daily. 06/13/23   Loyola Mast, MD  omeprazole (PRILOSEC) 40 MG capsule Take 1 capsule (40 mg total) by mouth daily. 07/07/23   Loyola Mast, MD  ondansetron (ZOFRAN-ODT) 4 MG disintegrating tablet Take 1 tablet (4 mg total) by mouth every 8 (eight) hours as needed for nausea or vomiting. 07/01/23   Loyola Mast, MD  ondansetron (ZOFRAN-ODT) 4 MG disintegrating tablet Take 1 tablet (4 mg total) by mouth every 8 (eight) hours as needed. Dissolve under tongue every 8 hours as needed. 10/16/23   Arnaldo Natal, NP  sildenafil (VIAGRA) 50 MG tablet Take 1-2 tablets (50-100 mg total) by mouth daily as needed for erectile dysfunction. 06/13/23   Loyola Mast, MD  Sodium Sulfate-Mag Sulfate-KCl (SUTAB) [phone] MG TABS Use as directed for colonoscopy. MANUFACTURER CODES!! BIN: [id number] PCN: CN GROUP: [id number] MEMBER ID: [id number];XLK AS SECONDARY INSURANCE ;NO PRIOR AUTHORIZATION 10/16/23  Arnaldo Natal, NP  tiZANidine (ZANAFLEX) 4 MG tablet Take 1 tablet (4 mg total) by mouth at bedtime. 10/06/22   Wallis Bamberg, PA-C  traZODone (DESYREL) 100 MG tablet Take 1 tablet (100 mg total) by mouth at bedtime as needed. 07/11/23   Loyola Mast, MD  trimethoprim-polymyxin b (POLYTRIM) ophthalmic solution Place 1 drop into the right eye every 4 (four) hours. 07/13/23   Claiborne Rigg, NP      Allergies    Patient has no known allergies.    Review of Systems   Review of Systems  Gastrointestinal:  Positive for abdominal  pain and vomiting.    Physical Exam Updated Vital Signs BP 133/84 (BP Location: Right Arm)   Pulse 96   Temp 99.1 F (37.3 C) (Oral)   Resp 16   Ht 5\' 6"  (1.676 m)   Wt 72.6 kg   SpO2 99%   BMI 25.82 kg/m  Physical Exam Vitals and nursing note reviewed.  Constitutional:      General: He is not in acute distress.    Appearance: Normal appearance.  HENT:     Head: Normocephalic and atraumatic.  Eyes:     Conjunctiva/sclera: Conjunctivae normal.  Cardiovascular:     Rate and Rhythm: Tachycardia present.     Comments: Initially tachycardic at 126 however following to IVF, heart rate decreases to 96bpm Pulmonary:     Effort: Pulmonary effort is normal. No respiratory distress.     Breath sounds: Normal breath sounds.  Abdominal:     General: Bowel sounds are normal.     Palpations: Abdomen is soft.     Tenderness: There is abdominal tenderness in the right lower quadrant and left lower quadrant. There is no guarding or rebound.     Comments: Negative heel tap and peritoneal signs  Musculoskeletal:     Right lower leg: No edema.     Left lower leg: No edema.  Skin:    General: Skin is warm.     Capillary Refill: Capillary refill takes less than 2 seconds.     Coloration: Skin is not jaundiced or pale.  Neurological:     Mental Status: He is alert and oriented to person, place, and time. Mental status is at baseline.     ED Results / Procedures / Treatments   Labs (all labs ordered are listed, but only abnormal results are displayed) Labs Reviewed  COMPREHENSIVE METABOLIC PANEL - Abnormal; Notable for the following components:      Result Value   Glucose, Bld 157 (*)    Total Protein 9.4 (*)    Albumin 5.1 (*)    Total Bilirubin 1.4 (*)    All other components within normal limits  CBC - Abnormal; Notable for the following components:   WBC 11.8 (*)    All other components within normal limits  URINALYSIS, ROUTINE W REFLEX MICROSCOPIC - Abnormal; Notable for the  following components:   Color, Urine AMBER (*)    APPearance HAZY (*)    Specific Gravity, Urine 1.039 (*)    Protein, ur 100 (*)    All other components within normal limits  RESP PANEL BY RT-PCR (RSV, FLU A&B, COVID)  RVPGX2  C DIFFICILE QUICK SCREEN W PCR REFLEX    GASTROINTESTINAL PANEL BY PCR, STOOL (REPLACES STOOL CULTURE)  LIPASE, BLOOD    EKG None  Radiology CT ABDOMEN PELVIS W CONTRAST Result Date: 10/26/2023 CLINICAL DATA:  Nausea, vomiting, diarrhea for 1 week. Abdominal pain.  EXAM: CT ABDOMEN AND PELVIS WITH CONTRAST TECHNIQUE: Multidetector CT imaging of the abdomen and pelvis was performed using the standard protocol following bolus administration of intravenous contrast. RADIATION DOSE REDUCTION: This exam was performed according to the departmental dose-optimization program which includes automated exposure control, adjustment of the mA and/or kV according to patient size and/or use of iterative reconstruction technique. CONTRAST:  OMNIPAQUE IOHEXOL 300 MG/ML  SOLN COMPARISON:  None Available. FINDINGS: Lower chest: No acute pleural or parenchymal lung disease. Hepatobiliary: No focal liver abnormality is seen. No gallstones, gallbladder wall thickening, or biliary dilatation. Pancreas: Unremarkable. No pancreatic ductal dilatation or surrounding inflammatory changes. Spleen: Normal in size without focal abnormality. Adrenals/Urinary Tract: Adrenal glands are unremarkable. Kidneys are normal, without renal calculi, focal lesion, or hydronephrosis. Bladder is unremarkable. Stomach/Bowel: No bowel obstruction or ileus. Gas fluid levels throughout the colon consistent with given history of diarrhea. Normal appendix right lower quadrant. Diverticulosis throughout the distal colon without evidence of acute diverticulitis. No bowel wall thickening or inflammatory change. Vascular/Lymphatic: No significant vascular findings are present. No enlarged abdominal or pelvic lymph nodes.  Reproductive: Prostate is unremarkable. Other: No free fluid or free intraperitoneal gas. No abdominal wall hernia. Musculoskeletal: No acute or destructive bony abnormalities. Reconstructed images demonstrate no additional findings. IMPRESSION: 1. Gas fluid levels throughout the colon consistent with history of diarrhea. No bowel obstruction or ileus. 2. Distal colonic diverticulosis without diverticulitis. Electronically Signed   By: Sharlet Salina M.D.   On: 10/26/2023 20:41    Procedures Procedures    Medications Ordered in ED Medications  acetaminophen (TYLENOL) tablet 975 mg (975 mg Oral Given 10/26/23 1645)  morphine (PF) 2 MG/ML injection 2 mg (2 mg Intravenous Given 10/26/23 1755)  ondansetron (ZOFRAN) injection 4 mg (4 mg Intravenous Given 10/26/23 1755)  lactated ringers bolus 1,000 mL (0 mLs Intravenous Stopped 10/26/23 1929)  iohexol (OMNIPAQUE) 300 MG/ML solution 100 mL (100 mLs Intravenous Contrast Given 10/26/23 2022)  morphine (PF) 2 MG/ML injection 2 mg (2 mg Intravenous Given 10/26/23 2112)  sodium chloride 0.9 % bolus 500 mL (0 mLs Intravenous Stopped 10/26/23 2208)    ED Course/ Medical Decision Making/ A&P Clinical Course as of 10/26/23 2245  Sun Oct 26, 2023  2240 Specific Gravity, Urine(!): 1.039 Dehydrated likely due to multiple episodes of V/D. Provided 2 IVF in ED significantly improving vital signs.  No tachycardia following IVF [LB]    Clinical Course User Index [LB] Judithann Sheen, PA                                 Medical Decision Making Amount and/or Complexity of Data Reviewed Labs: ordered. Radiology: ordered.  Risk OTC drugs. Prescription drug management.   Patient presents to the ED for concern of worsening vomiting and diarrhea over the past 24 hours, this involves an extensive number of treatment options, and is a complaint that carries with it a high risk of complications and morbidity.  The differential diagnosis includes COVID, flu, colitis,  gastroenteritis,   Co morbidities that complicate the patient evaluation  Chronic diarrhea   Additional history obtained:  Additional history obtained from [facility], Nursing, and Outside Medical Records   External records from outside source obtained and reviewed including  Bedside Triage RN note [facility] note from 10/16/2023   Lab Tests:  I Ordered, and personally interpreted labs.  The pertinent results include:   Leukocytosis of 11.8 CBG  157 Total protein 8.4 Albumin 5.1   Imaging Studies ordered:  I ordered imaging studies including CT abdomen pelvis with contrast I independently visualized and interpreted imaging which showed gas/fluid levels throughout the colon consistent with diarrhea.  No bowel obstruction, ileus, nor diverticulitis I agree with the radiologist interpretation   Cardiac Monitoring:  The patient was maintained on a cardiac monitor.  I personally viewed and interpreted the cardiac monitored which showed an underlying rhythm of: Sinus tachycardia 122.  Inverted T in V3-V6.  Previous EKG from 2019 is not similar however this is 5 years ago   Medicines ordered and prescription drug management:  I ordered medication including morphine, Zofran, IVF for pain, nausea, dehydration Reevaluation of the patient after these medicines showed that the patient improved I have reviewed the patients home medicines and have made adjustments as needed    Problem List / ED Course:  Generalized abdominal pain Nausea, vomiting Chronic diarrhea Dehydration Has chronic diarrhea and vomiting at baseline.  He is followed by [facility] for this CT negative for acute emergent cause of symptoms Workup significant for mild leukocytosis of 11.8 and dehydration noted on urine Provided analgesia, IVF, Zofran for symptoms.  Patient reports significant improvement following Do not see emergent cause of symptoms at this time. Low concern for sepsis. He may have a mild  gastroenteritis that is worsening chronic symptoms. C diff neg. Stool culture pending Patient has Zofran at home and we will have patient follow-up with outpatient GI Discussed ED workup, disposition, return to emergency department precautions with patient expresses understand agrees with plan.  All questions answered to his satisfaction.  He is agreeable to plan.  Discharge instructions provided on paperwork   Reevaluation:  After the interventions noted above, I reevaluated the patient and found that they have :improved   Social Determinants of Health:  Follows with [facility]   Dispostion:  After consideration of the diagnostic results and the patients response to treatment, I feel that the patent would benefit from patient management with symptomatic care.   Discussed patient with Dr. Lockie Mola who reviewed ED workup and agrees with plan Final Clinical Impression(s) / ED Diagnoses Final diagnoses:  Generalized abdominal pain  Chronic diarrhea  Nausea and vomiting, unspecified vomiting type  Dehydration    Rx / DC Orders ED Discharge Orders     None         Judithann Sheen, PA 10/26/23 2246    Virgina Norfolk, DO 10/26/23 2254

## 2023-10-26 NOTE — Discharge Instructions (Addendum)
 Thank you for let us evaluate you today.  Your ET scan showed diarrhea but no other acute pathology to explain symptoms.  We gave you fluids here in emergency department as you were dehydrated as well as pain medicine.  Please make sure to drink plenty of water, Gatorade, Pedialyte, low sugar juices, chicken broth at home.  We have a stool culture pending that should result within the next 24 hours.  We will call you if you need to be placed on antibiotics.  You can also check your MyChart for results.  Return to emergency department if you experience significant worsening of symptoms

## 2023-10-27 ENCOUNTER — Other Ambulatory Visit: Payer: Self-pay

## 2023-10-27 ENCOUNTER — Telehealth: Payer: Self-pay

## 2023-10-27 ENCOUNTER — Encounter (HOSPITAL_COMMUNITY): Payer: Self-pay

## 2023-10-27 ENCOUNTER — Telehealth: Payer: Self-pay | Admitting: Nurse Practitioner

## 2023-10-27 ENCOUNTER — Encounter: Payer: Self-pay | Admitting: Family Medicine

## 2023-10-27 ENCOUNTER — Emergency Department (HOSPITAL_COMMUNITY)
Admission: EM | Admit: 2023-10-27 | Discharge: 2023-10-27 | Disposition: A | Discharge status: Home / Self Care | Payer: BC Managed Care – PPO | Attending: Emergency Medicine | Admitting: Emergency Medicine

## 2023-10-27 DIAGNOSIS — R197 Diarrhea, unspecified: Secondary | ICD-10-CM | POA: Insufficient documentation

## 2023-10-27 DIAGNOSIS — D649 Anemia, unspecified: Secondary | ICD-10-CM

## 2023-10-27 LAB — COMPREHENSIVE METABOLIC PANEL
ALT: 26 U/L (ref 0–44)
AST: 27 U/L (ref 15–41)
Albumin: 4.1 g/dL (ref 3.5–5.0)
Alkaline Phosphatase: 46 U/L (ref 38–126)
Anion gap: 10 (ref 5–15)
BUN: 12 mg/dL (ref 6–20)
CO2: 23 mmol/L (ref 22–32)
Calcium: 9.3 mg/dL (ref 8.9–10.3)
Chloride: 101 mmol/L (ref 98–111)
Creatinine, Ser: 1.42 mg/dL — ABNORMAL HIGH (ref 0.61–1.24)
GFR, Estimated: >60 mL/min (ref >60)
Glucose, Bld: 137 mg/dL — ABNORMAL HIGH (ref 70–99)
Potassium: 3.7 mmol/L (ref 3.5–5.1)
Sodium: 134 mmol/L — ABNORMAL LOW (ref 135–145)
Total Bilirubin: 0.8 mg/dL (ref 0.0–1.2)
Total Protein: 7.6 g/dL (ref 6.5–8.1)

## 2023-10-27 LAB — CBC
HCT: 41.7 % (ref 39.0–52.0)
Hemoglobin: 13.5 g/dL (ref 13.0–17.0)
MCH: 27.8 pg (ref 26.0–34.0)
MCHC: 32.4 g/dL (ref 30.0–36.0)
MCV: 86 fL (ref 80.0–100.0)
Platelets: 237 10*3/uL (ref 150–400)
RBC: 4.85 MIL/uL (ref 4.22–5.81)
RDW: 13.3 % (ref 11.5–15.5)
WBC: 6.9 10*3/uL (ref 4.0–10.5)
nRBC: 0 % (ref 0.0–0.2)

## 2023-10-27 LAB — LIPASE, BLOOD: Lipase: 30 U/L (ref 11–51)

## 2023-10-27 NOTE — ED Notes (Signed)
Pt ambulated from ed with steady gait.

## 2023-10-27 NOTE — ED Notes (Signed)
 Pt & his spouse, refused nasal swab order, patients spouse said that, "the pt already had a nasal swab yesterday ,and the results came out negative, so she doesn't see the need for him to have a nasal swab today."

## 2023-10-27 NOTE — ED Triage Notes (Signed)
 Pt c/o diarrhea and body achesx2-3d. Pt denies N/V.

## 2023-10-27 NOTE — Discharge Instructions (Signed)
 Evaluation today was overall reassuring.  Recommend you do follow-up with GI if your diarrhea continues.  If it resolves would recommend you follow-up with your PCP.  Also strongly recommend the importance of assertive hydration at home with water and Gatorade in the meantime.  If you develop abdominal pain, fever, vomiting, blood in the stool or vomit or any other concerning symptom please return emergency department further evaluation.

## 2023-10-27 NOTE — ED Notes (Signed)
 Pt declines resp swab at this time, states he was tested less than 12 hrs ago.

## 2023-10-27 NOTE — ED Provider Notes (Signed)
 [place] EMERGENCY DEPARTMENT AT [facility] Provider Note   CSN: [id number] Arrival date & time: 10/27/23  7829     History  Chief Complaint  Patient presents with   Diarrhea   HPI Juan Esparza is a 43 y.o. male with history of GERD and alcohol use disorder presenting for diarrhea and bodyaches.  Symptoms have been going on for 2 to 3 days.  Was seen for same yesterday at [facility] with a reassuring workup.  CT scan was nonacute.  He states that symptoms have not changed or worsened in any way since his evaluation yesterday.  He is here primarily because he wants a consult with GI.  He denies abdominal pain.  Denies nausea vomiting.  Denies blood in the stool.  Denies any recent use of antibiotics.   Diarrhea      Home Medications Prior to Admission medications   Medication Sig Start Date End Date Taking? Authorizing Provider  acamprosate (CAMPRAL) 333 MG tablet Take 2 tablets (666 mg total) by mouth 3 (three) times daily. 07/11/23   Loyola Mast, MD  albuterol (VENTOLIN HFA) 108 (90 Base) MCG/ACT inhaler Inhale 2 puffs into the lungs every 6 (six) hours as needed for wheezing or shortness of breath (Cough). 07/11/23   Loyola Mast, MD  benzonatate (TESSALON) 100 MG capsule Take 1 capsule (100 mg total) by mouth 3 (three) times daily as needed for cough. 09/18/23   Freddy Finner, NP  brompheniramine-pseudoephedrine-DM 30-2-10 MG/5ML syrup Take 5 mLs by mouth 4 (four) times daily as needed. 09/18/23   Freddy Finner, NP  fluticasone (FLONASE) 50 MCG/ACT nasal spray Place 1 spray into both nostrils daily. Begin by using 2 sprays in each nare daily for 3 to 5 days, then decrease to 1 spray in each nare daily. 06/13/23   Loyola Mast, MD  fluticasone (FLONASE) 50 MCG/ACT nasal spray Place 2 sprays into both nostrils daily. 09/18/23   Freddy Finner, NP  hydrOXYzine (VISTARIL) 25 MG capsule Take 1 capsule (25 mg total) by mouth at bedtime as needed. 07/11/23    Loyola Mast, MD  Olopatadine HCl (PATADAY) 0.2 % SOLN Apply 1 drop to eye daily. 06/13/23   Loyola Mast, MD  omeprazole (PRILOSEC) 40 MG capsule Take 1 capsule (40 mg total) by mouth daily. 07/07/23   Loyola Mast, MD  ondansetron (ZOFRAN-ODT) 4 MG disintegrating tablet Take 1 tablet (4 mg total) by mouth every 8 (eight) hours as needed for nausea or vomiting. 07/01/23   Loyola Mast, MD  ondansetron (ZOFRAN-ODT) 4 MG disintegrating tablet Take 1 tablet (4 mg total) by mouth every 8 (eight) hours as needed. Dissolve under tongue every 8 hours as needed. 10/16/23   Arnaldo Natal, NP  sildenafil (VIAGRA) 50 MG tablet Take 1-2 tablets (50-100 mg total) by mouth daily as needed for erectile dysfunction. 06/13/23   Loyola Mast, MD  Sodium Sulfate-Mag Sulfate-KCl (SUTAB) [phone] MG TABS Use as directed for colonoscopy. MANUFACTURER CODES!! BIN: [id number] PCN: CN GROUP: [id number] MEMBER ID: [id number];VOZ AS SECONDARY INSURANCE ;NO PRIOR AUTHORIZATION 10/16/23   Arnaldo Natal, NP  tiZANidine (ZANAFLEX) 4 MG tablet Take 1 tablet (4 mg total) by mouth at bedtime. 10/06/22   Wallis Bamberg, PA-C  traZODone (DESYREL) 100 MG tablet Take 1 tablet (100 mg total) by mouth at bedtime as needed. 07/11/23   Loyola Mast, MD  trimethoprim-polymyxin b (POLYTRIM) ophthalmic solution Place 1 drop into the  right eye every 4 (four) hours. 07/13/23   Claiborne Rigg, NP      Allergies    Patient has no known allergies.    Review of Systems   Review of Systems  Gastrointestinal:  Positive for diarrhea.    Physical Exam Updated Vital Signs BP (!) 137/96   Pulse 93   Temp 99.3 F (37.4 C) (Oral)   Resp 16   Ht 5\' 6"  (1.676 m)   Wt 72.6 kg   SpO2 100%   BMI 25.83 kg/m  Physical Exam Vitals and nursing note reviewed.  HENT:     Head: Normocephalic and atraumatic.     Mouth/Throat:     Mouth: Mucous membranes are moist.  Eyes:     General:        Right eye: No  discharge.        Left eye: No discharge.     Conjunctiva/sclera: Conjunctivae normal.  Cardiovascular:     Rate and Rhythm: Normal rate and regular rhythm.     Pulses: Normal pulses.     Heart sounds: Normal heart sounds.  Pulmonary:     Effort: Pulmonary effort is normal.     Breath sounds: Normal breath sounds.  Abdominal:     General: Abdomen is flat.     Palpations: Abdomen is soft.  Skin:    General: Skin is warm and dry.  Neurological:     General: No focal deficit present.  Psychiatric:        Mood and Affect: Mood normal.     ED Results / Procedures / Treatments   Labs (all labs ordered are listed, but only abnormal results are displayed) Labs Reviewed  COMPREHENSIVE METABOLIC PANEL - Abnormal; Notable for the following components:      Result Value   Sodium 134 (*)    Glucose, Bld 137 (*)    Creatinine, Ser 1.42 (*)    All other components within normal limits  LIPASE, BLOOD  CBC    EKG None  Radiology CT ABDOMEN PELVIS W CONTRAST Result Date: 10/26/2023 CLINICAL DATA:  Nausea, vomiting, diarrhea for 1 week. Abdominal pain. EXAM: CT ABDOMEN AND PELVIS WITH CONTRAST TECHNIQUE: Multidetector CT imaging of the abdomen and pelvis was performed using the standard protocol following bolus administration of intravenous contrast. RADIATION DOSE REDUCTION: This exam was performed according to the departmental dose-optimization program which includes automated exposure control, adjustment of the mA and/or kV according to patient size and/or use of iterative reconstruction technique. CONTRAST:  OMNIPAQUE IOHEXOL 300 MG/ML  SOLN COMPARISON:  None Available. FINDINGS: Lower chest: No acute pleural or parenchymal lung disease. Hepatobiliary: No focal liver abnormality is seen. No gallstones, gallbladder wall thickening, or biliary dilatation. Pancreas: Unremarkable. No pancreatic ductal dilatation or surrounding inflammatory changes. Spleen: Normal in size without focal  abnormality. Adrenals/Urinary Tract: Adrenal glands are unremarkable. Kidneys are normal, without renal calculi, focal lesion, or hydronephrosis. Bladder is unremarkable. Stomach/Bowel: No bowel obstruction or ileus. Gas fluid levels throughout the colon consistent with given history of diarrhea. Normal appendix right lower quadrant. Diverticulosis throughout the distal colon without evidence of acute diverticulitis. No bowel wall thickening or inflammatory change. Vascular/Lymphatic: No significant vascular findings are present. No enlarged abdominal or pelvic lymph nodes. Reproductive: Prostate is unremarkable. Other: No free fluid or free intraperitoneal gas. No abdominal wall hernia. Musculoskeletal: No acute or destructive bony abnormalities. Reconstructed images demonstrate no additional findings. IMPRESSION: 1. Gas fluid levels throughout the colon consistent with history of  diarrhea. No bowel obstruction or ileus. 2. Distal colonic diverticulosis without diverticulitis. Electronically Signed   By: Sharlet Salina M.D.   On: 10/26/2023 20:41    Procedures Procedures    Medications Ordered in ED Medications - No data to display  ED Course/ Medical Decision Making/ A&P                                 Medical Decision Making Amount and/or Complexity of Data Reviewed Labs: ordered.   43 year old well-appearing male presenting for diarrhea and bodyaches.  Exam was unremarkable.  Overall he looks well, in no acute distress and hemodynamically stable.  I reviewed his thorough evaluation yesterday for his diarrhea at [facility], ED.  CT scan revealed findings consistent with diarrhea but otherwise nonacute.  Advised him that it would be appropriate for him to follow-up with GI in the outpatient setting and that there is no indication for consult at this time.  He was agreeable to this plan.  Discussed return precautions with him.  Also advised him to follow-up with PCP.  Advised supportive  treatment at home.  Discharged good condition.        Final Clinical Impression(s) / ED Diagnoses Final diagnoses:  Diarrhea, unspecified type    Rx / DC Orders ED Discharge Orders     None         Gareth Eagle, PA-C 10/27/23 0920    Margarita Grizzle, MD 11/03/23 [phone]

## 2023-10-27 NOTE — ED Notes (Signed)
 Cannot urinate at

## 2023-10-27 NOTE — Transitions of Care (Post Inpatient/ED Visit) (Signed)
 10/27/2023  Name: Juan Esparza MRN: [id number] DOB: 08/18/1981  Today's TOC FU Call Status: Today's TOC FU Call Status:: Successful TOC FU Call Completed TOC FU Call Complete Date: 10/27/23 Patient's Name and Date of Birth confirmed.  Transition Care Management Follow-up Telephone Call Date of Discharge: 10/26/23 Discharge Facility: [facility]) Type of Discharge: Emergency Department Reason for ED Visit: Other: (abdominal pain, diarrhea and vomiting) How have you been since you were released from the hospital?: Same Any questions or concerns?: Yes Patient Questions/Concerns:: Pt is still having mild diarrhea with abdominal pain and decrease in appetite Patient Questions/Concerns Addressed: Provided Patient Educational Materials  Items Reviewed: Did you receive and understand the discharge instructions provided?: Yes Medications obtained,verified, and reconciled?: Yes (Medications Reviewed) Any new allergies since your discharge?: No Dietary orders reviewed?: NA Do you have support at home?: No  Medications Reviewed Today: Medications Reviewed Today     Reviewed by Leroy Kennedy, CMA (Certified Medical Assistant) on 10/27/23 at 1108  Med List Status: <None>   Medication Order Taking? Sig Documenting Provider Last Dose Status Informant  acamprosate (CAMPRAL) 333 MG tablet [id number] Yes Take 2 tablets (666 mg total) by mouth 3 (three) times daily. Loyola Mast, MD Taking Active   albuterol (VENTOLIN HFA) 108 [phone] Base) MCG/ACT inhaler [id number] Yes Inhale 2 puffs into the lungs every 6 (six) hours as needed for wheezing or shortness of breath (Cough). Loyola Mast, MD Taking Active   benzonatate (TESSALON) 100 MG capsule [id number] Yes Take 1 capsule (100 mg total) by mouth 3 (three) times daily as needed for cough. Freddy Finner, NP Taking Active   brompheniramine-pseudoephedrine-DM 30-2-10 MG/5ML syrup [id number] Yes Take 5 mLs by mouth 4 (four) times daily as  needed. Freddy Finner, NP Taking Active   fluticasone [facility]) 50 MCG/ACT nasal spray [id number] Yes Place 1 spray into both nostrils daily. Begin by using 2 sprays in each nare daily for 3 to 5 days, then decrease to 1 spray in each nare daily. Loyola Mast, MD Taking Active   fluticasone [facility]) 50 MCG/ACT nasal spray [id number] Yes Place 2 sprays into both nostrils daily. Freddy Finner, NP Taking Active   hydrOXYzine (VISTARIL) 25 MG capsule [id number] Yes Take 1 capsule (25 mg total) by mouth at bedtime as needed. Loyola Mast, MD Taking Active   Olopatadine HCl (PATADAY) 0.2 % SOLN [id number] Yes Apply 1 drop to eye daily. Loyola Mast, MD Taking Active   omeprazole (PRILOSEC) 40 MG capsule [id number] Yes Take 1 capsule (40 mg total) by mouth daily. Loyola Mast, MD Taking Active   ondansetron (ZOFRAN-ODT) 4 MG disintegrating tablet [id number] Yes Take 1 tablet (4 mg total) by mouth every 8 (eight) hours as needed for nausea or vomiting. Loyola Mast, MD Taking Active   ondansetron (ZOFRAN-ODT) 4 MG disintegrating tablet [id number] Yes Take 1 tablet (4 mg total) by mouth every 8 (eight) hours as needed. Dissolve under tongue every 8 hours as needed. Arnaldo Natal, NP Taking Active   sildenafil (VIAGRA) 50 MG tablet [id number] Yes Take 1-2 tablets (50-100 mg total) by mouth daily as needed for erectile dysfunction. Loyola Mast, MD Taking Active   Sodium Sulfate-Mag Sulfate-KCl (SUTAB) [phone] MG TABS [id number] Yes Use as directed for colonoscopy. MANUFACTURER CODES!! BIN: [id number] PCN: CN GROUP: [id number] MEMBER ID: [id number];HBZ AS SECONDARY INSURANCE ;NO PRIOR AUTHORIZATION Arnaldo Natal, NP Taking Active   tiZANidine (ZANAFLEX) 4 MG tablet [id number] Yes  Take 1 tablet (4 mg total) by mouth at bedtime. Wallis Bamberg, PA-C Taking Active   traZODone (DESYREL) 100 MG tablet [id number] Yes Take 1 tablet (100 mg total) by mouth at bedtime as needed. Loyola Mast, MD Taking Active   trimethoprim-polymyxin b [facility]) ophthalmic solution [id number] Yes Place 1 drop into the right eye every 4 (four) hours. Claiborne Rigg, NP Taking Active             Home Care and Equipment/Supplies: Were Home Health Services Ordered?: No Any new equipment or medical supplies ordered?: No  Functional Questionnaire: Do you need assistance with bathing/showering or dressing?: No Do you need assistance with meal preparation?: No Do you need assistance with eating?: No Do you have difficulty maintaining continence: No Do you need assistance with getting out of bed/getting out of a chair/moving?: No Do you have difficulty managing or taking your medications?: No  Follow up appointments reviewed: PCP Follow-up appointment confirmed?: Yes Date of PCP follow-up appointment?: 11/03/23 Follow-up Provider: [facility]. V[facility]ps [facility] Follow-up appointment confirmed?: NA Do you need transportation to your follow-up appointment?: No Do you understand care options if your condition(s) worsen?: Yes-patient verbalized understanding    SIGNATURE Jenny Reichmann

## 2023-10-27 NOTE — Telephone Encounter (Signed)
 Patient wants to know if he can get a note for him to be out of work Monday- Wednesday.  Going to work on Thursday.  He went to the ER/urgent care twice.   Please review and advise.   Thanks. Dm/cma

## 2023-10-27 NOTE — Telephone Encounter (Signed)
 Inbound call from patient's wife, calling wishing to speak to a nurse in regards to husband's recent hospitalization. She would like husband to be seen ASAP for his diarrhea.

## 2023-10-28 NOTE — Telephone Encounter (Signed)
 Spoke to pt wife Shatara. Shatatra stated that his diarrhea is better today and has only had 1 episode. Shatara questioned if pt could have the EGD/ Colon sooner. Dr. Chales Abrahams schedule was reviewed and noted that there was no sooner availability. Shatara made aware. Shatara verbalized understanding with all questions answered.

## 2023-10-29 LAB — GASTROINTESTINAL PANEL BY PCR, STOOL (REPLACES STOOL CULTURE)

## 2023-11-03 ENCOUNTER — Inpatient Hospital Stay: Payer: BC Managed Care – PPO | Admitting: Family Medicine

## 2023-11-14 ENCOUNTER — Ambulatory Visit: Payer: BC Managed Care – PPO | Admitting: Family Medicine

## 2023-11-27 ENCOUNTER — Telehealth: Payer: Self-pay | Admitting: Family Medicine

## 2023-11-27 NOTE — Telephone Encounter (Signed)
 02/19/2023 no show 10/10/2023 no show 10/14/2023 Final warning letter sent 11/14/2023 same day cancellation by e2c2, no reason noted 11/28/2023 pt cancelled via mychart day prior  Do you want to proceed with dismissal?

## 2023-11-28 ENCOUNTER — Ambulatory Visit: Admitting: Family Medicine

## 2023-11-28 ENCOUNTER — Encounter: Payer: Self-pay | Admitting: Family Medicine

## 2023-11-28 NOTE — Telephone Encounter (Signed)
 Dismissal generated

## 2023-12-05 ENCOUNTER — Encounter: Payer: BC Managed Care – PPO | Admitting: Pediatrics

## 2024-01-01 ENCOUNTER — Telehealth: Payer: Self-pay | Admitting: *Deleted

## 2024-01-01 ENCOUNTER — Encounter

## 2024-01-01 NOTE — Telephone Encounter (Signed)
 Pt could not talk at this time. Instructed pt to call and reschedule at his convenience his PV with RN and gave # for him to do so. No other questions at this time.

## 2024-01-02 ENCOUNTER — Other Ambulatory Visit (HOSPITAL_COMMUNITY): Payer: Self-pay

## 2024-01-06 NOTE — Telephone Encounter (Signed)
 Mail returned from [address] [place], [place] [address], dismissal sent via mychart

## 2024-01-12 ENCOUNTER — Encounter: Admitting: Pediatrics

## 2024-02-03 ENCOUNTER — Encounter

## 2024-02-03 ENCOUNTER — Telehealth: Payer: Self-pay | Admitting: *Deleted

## 2024-02-03 NOTE — Telephone Encounter (Signed)
 Attempt to reach pt for pre-visit. LM with call back #.  Will attempt to reach again in 5 min due to no other # listed in profile  Second attempt to reach pt for pre-vist unsuccessful. LM with facility # for pt to call back. Instructed pt to call # given by end of the day and reschedule the pre-visit  with RN or the scheduled procedure will be canceled.

## 2024-02-20 ENCOUNTER — Encounter: Payer: Self-pay | Admitting: Pediatrics

## 2024-02-20 ENCOUNTER — Ambulatory Visit (AMBULATORY_SURGERY_CENTER)

## 2024-02-20 VITALS — Ht 66.0 in | Wt 160.0 lb

## 2024-02-20 DIAGNOSIS — R112 Nausea with vomiting, unspecified: Secondary | ICD-10-CM

## 2024-02-20 DIAGNOSIS — K219 Gastro-esophageal reflux disease without esophagitis: Secondary | ICD-10-CM

## 2024-02-20 DIAGNOSIS — K529 Noninfective gastroenteritis and colitis, unspecified: Secondary | ICD-10-CM

## 2024-02-20 DIAGNOSIS — F1010 Alcohol abuse, uncomplicated: Secondary | ICD-10-CM

## 2024-02-20 MED ORDER — SUTAB 1479-225-188 MG PO TABS
ORAL_TABLET | ORAL | 0 refills | Status: DC
Start: 2024-02-20 — End: 2024-03-11

## 2024-02-20 NOTE — Progress Notes (Signed)

## 2024-02-24 ENCOUNTER — Encounter: Admitting: Pediatrics

## 2024-03-04 NOTE — Telephone Encounter (Signed)
 Pt would like to be reconsidered to be your pt again, If no I can call him with documentation of missed appts.

## 2024-03-08 ENCOUNTER — Telehealth: Payer: Self-pay | Admitting: Family Medicine

## 2024-03-08 NOTE — Telephone Encounter (Signed)
 Pt would like a call back. Pt want to know why he was discharge from practice

## 2024-03-08 NOTE — Progress Notes (Unsigned)
 [facility] History and Physical   Primary Care Physician:  Patient, No Pcp Per   Reason for Procedure:  Nausea, vomiting, heartburn, chronic diarrhea, epigastric pain  Plan:    EGD and colonoscopy     HPI: Juan Esparza is a 43 y.o. male undergoing upper endoscopy and colonoscopy for investigation of nausea, vomiting, heartburn, chronic diarrhea and epigastric abdominal pain.  Patient reports having episodes of nonbloody, nonbilious emesis 2-3 times a week for the last 4 years.  Endorses associated heartburn.  Labs have shown normal LFTs and lipase.  Right upper quadrant ultrasound showed normal gallbladder and liver.  Also describes having frequent, nonbloody, watery bowel movements.  Stool studies negative for enteric pathogens.  Thyroid profile normal.  Patient previously had mild anemia which is now resolved.  No prior EGD or colonoscopy.  No family history of colorectal cancer or polyps   Past Medical History:  Diagnosis Date   Alcoholism (HCC)    Anxiety    Depression    GERD (gastroesophageal reflux disease)    Sleep apnea     Past Surgical History:  Procedure Laterality Date   NO PAST SURGERIES      Prior to Admission medications   Medication Sig Start Date End Date Taking? Authorizing Provider  acamprosate  (CAMPRAL ) 333 MG tablet Take 2 tablets (666 mg total) by mouth 3 (three) times daily. Patient not taking: Reported on 02/20/2024 07/11/23   [name], MD  albuterol  (VENTOLIN  HFA) 108 [phone] Base) MCG/ACT inhaler Inhale 2 puffs into the lungs every 6 (six) hours as needed for wheezing or shortness of breath (Cough). 07/11/23   [name], MD  benzonatate  (TESSALON ) 100 MG capsule Take 1 capsule (100 mg total) by mouth 3 (three) times daily as needed for cough. 09/18/23   [name], NP  brompheniramine-pseudoephedrine-DM 30-2-10 MG/5ML syrup Take 5 mLs by mouth 4 (four) times daily as needed. 09/18/23   [name], NP  fluticasone  (FLONASE )  50 MCG/ACT nasal spray Place 1 spray into both nostrils daily. Begin by using 2 sprays in each nare daily for 3 to 5 days, then decrease to 1 spray in each nare daily. 06/13/23   [name], MD  fluticasone  (FLONASE ) 50 MCG/ACT nasal spray Place 2 sprays into both nostrils daily. 09/18/23   [name], NP  hydrOXYzine  (VISTARIL ) 25 MG capsule Take 1 capsule (25 mg total) by mouth at bedtime as needed. 07/11/23   [name], MD  Olopatadine  HCl (PATADAY ) 0.2 % SOLN Apply 1 drop to eye daily. 06/13/23   [name], MD  omeprazole  (PRILOSEC) 40 MG capsule Take 1 capsule (40 mg total) by mouth daily. 07/07/23   [name], MD  ondansetron  (ZOFRAN -ODT) 4 MG disintegrating tablet Take 1 tablet (4 mg total) by mouth every 8 (eight) hours as needed for nausea or vomiting. 07/01/23   [name], MD  ondansetron  (ZOFRAN -ODT) 4 MG disintegrating tablet Take 1 tablet (4 mg total) by mouth every 8 (eight) hours as needed. Dissolve under tongue every 8 hours as needed. 10/16/23   [name], [name], NP  sildenafil  (VIAGRA ) 50 MG tablet Take 1-2 tablets (50-100 mg total) by mouth daily as needed for erectile dysfunction. 06/13/23   [name], MD  Sodium Sulfate-Mag Sulfate-KCl (SUTAB ) [phone] MG TABS Use as directed for colonoscopy. MANUFACTURER CODES!! BIN: [id number] PCN: CN GROUP: [id number] MEMBER ID: [id number];MLW AS SECONDARY INSURANCE ;NO PRIOR AUTHORIZATION 02/20/24   [name], [name]  M, MD  tiZANidine  (ZANAFLEX ) 4 MG tablet Take 1 tablet (4 mg total) by mouth at bedtime. 10/06/22   [name], PA-C  traZODone  (DESYREL ) 100 MG tablet Take 1 tablet (100 mg total) by mouth at bedtime as needed. 07/11/23   [name], MD  trimethoprim -polymyxin b  (POLYTRIM ) ophthalmic solution Place 1 drop into the right eye every 4 (four) hours. 07/13/23   [name], NP    Current Outpatient Medications  Medication Sig Dispense Refill   omeprazole  (PRILOSEC) 40 MG capsule Take 1  capsule (40 mg total) by mouth daily. 90 capsule 3   traZODone  (DESYREL ) 100 MG tablet Take 1 tablet (100 mg total) by mouth at bedtime as needed. 90 tablet 3   acamprosate  (CAMPRAL ) 333 MG tablet Take 2 tablets (666 mg total) by mouth 3 (three) times daily. (Patient not taking: No sig reported) 180 tablet 2   albuterol  (VENTOLIN  HFA) 108 (90 Base) MCG/ACT inhaler Inhale 2 puffs into the lungs every 6 (six) hours as needed for wheezing or shortness of breath (Cough). 18 g 0   benzonatate  (TESSALON ) 100 MG capsule Take 1 capsule (100 mg total) by mouth 3 (three) times daily as needed for cough. 30 capsule 0   brompheniramine-pseudoephedrine-DM 30-2-10 MG/5ML syrup Take 5 mLs by mouth 4 (four) times daily as needed. 120 mL 0   fluticasone  (FLONASE ) 50 MCG/ACT nasal spray Place 1 spray into both nostrils daily. Begin by using 2 sprays in each nare daily for 3 to 5 days, then decrease to 1 spray in each nare daily. 15.8 mL 2   hydrOXYzine  (VISTARIL ) 25 MG capsule Take 1 capsule (25 mg total) by mouth at bedtime as needed. 30 capsule 6   Olopatadine  HCl (PATADAY ) 0.2 % SOLN Apply 1 drop to eye daily. 2.5 mL 1   ondansetron  (ZOFRAN -ODT) 4 MG disintegrating tablet Take 1 tablet (4 mg total) by mouth every 8 (eight) hours as needed. Dissolve under tongue every 8 hours as needed. 30 tablet 0   sildenafil  (VIAGRA ) 50 MG tablet Take 1-2 tablets (50-100 mg total) by mouth daily as needed for erectile dysfunction. 30 tablet 5   tiZANidine  (ZANAFLEX ) 4 MG tablet Take 1 tablet (4 mg total) by mouth at bedtime. 30 tablet 0   trimethoprim -polymyxin b  (POLYTRIM ) ophthalmic solution Place 1 drop into the right eye every 4 (four) hours. 10 mL 0   Current Facility-Administered Medications  Medication Dose Route Frequency Provider Last Rate Last Admin   0.9 %  sodium chloride  infusion  500 mL Intravenous Once Azam Gervasi, [name] HERO, MD        Allergies as of 03/11/2024   (No Known Allergies)    Family History  Problem  Relation Age of Onset   Asthma Mother    Depression Mother    Stroke Mother    GER disease Mother    Hiatal hernia Mother    Alcohol abuse Father    Drug abuse Father    Anuerysm Father        brain   Asthma Sister    Anxiety disorder Sister    Heart failure Maternal Uncle        x 2 uncles   Diabetes Maternal Grandmother    Diabetes Paternal Grandmother    Lung cancer Paternal Grandfather    Colon cancer Neg Hx    Rectal cancer Neg Hx    Stomach cancer Neg Hx    Esophageal cancer Neg Hx     Social History  Socioeconomic History   Marital status: Single    Spouse name: Not on file   Number of children: 1   Years of education: Not on file   Highest education level: Not on file  Occupational History    Employer: [employer]  Tobacco Use   Smoking status: Former    Current packs/day: 0.00    Average packs/day: 0.5 packs/day for 2.0 years (1.0 ttl pk-yrs)    Types: Cigarettes    Start date: 2019    Quit date: 2021    Years since quitting: 4.5   Smokeless tobacco: Never  Vaping Use   Vaping status: Some Days  Substance and Sexual Activity   Alcohol use: Yes    Alcohol/week: 28.0 standard drinks of alcohol    Types: 28 Cans of beer per week    Comment: 2- 3 beers/day   Drug use: No   Sexual activity: Yes    Partners: Female  Other Topics Concern   Not on file  Social History Narrative   Not on file   Social Drivers of Health   Financial Resource Strain: Not on file  Food Insecurity: Not on file  Transportation Needs: Not on file  Physical Activity: Not on file  Stress: Not on file  Social Connections: Not on file  Intimate Partner Violence: Not on file    Review of Systems:  All other review of systems negative except as mentioned in the HPI.  Physical Exam: Vital signs BP (!) 137/105   Pulse 62   Temp (!) 97.3 F (36.3 C) (Temporal)   Resp 17   Ht 5' 6 (1.676 m)   Wt 160 lb (72.6 kg)   SpO2 99%   BMI 25.82 kg/m   General:   Alert,   Well-developed, well-nourished, pleasant and cooperative in NAD Airway:  Mallampati 2 Lungs:  Clear throughout to auscultation.   Heart:  Regular rate and rhythm; no murmurs, clicks, rubs,  or gallops. Abdomen:  Soft, nontender and nondistended. Normal bowel sounds.   Neuro/Psych:  Normal mood and affect. A and O x 3  [name] Hausen, MD [facility]

## 2024-03-09 NOTE — Telephone Encounter (Signed)
 Spoke with pt and he understood, he can no longer schedule with Dr Thedora.

## 2024-03-09 NOTE — Telephone Encounter (Signed)
 See other tel enc

## 2024-03-09 NOTE — Telephone Encounter (Signed)
 Pt has missed total of 8 visits with our office. Pt had 3 missed visits within less than 12 months. We are unable to reverse dismissal.  Missed Visits within less than 12 months: 02/19/2023 no show 10/10/2023 no show 11/14/2023 same day cancellation

## 2024-03-11 ENCOUNTER — Ambulatory Visit: Admitting: Pediatrics

## 2024-03-11 ENCOUNTER — Encounter: Payer: Self-pay | Admitting: Pediatrics

## 2024-03-11 ENCOUNTER — Telehealth: Payer: Self-pay | Admitting: Internal Medicine

## 2024-03-11 VITALS — BP 125/79 | HR 79 | Temp 97.3°F | Resp 11 | Ht 66.0 in | Wt 160.0 lb

## 2024-03-11 DIAGNOSIS — R112 Nausea with vomiting, unspecified: Secondary | ICD-10-CM

## 2024-03-11 DIAGNOSIS — K2950 Unspecified chronic gastritis without bleeding: Secondary | ICD-10-CM

## 2024-03-11 DIAGNOSIS — G8929 Other chronic pain: Secondary | ICD-10-CM

## 2024-03-11 DIAGNOSIS — K219 Gastro-esophageal reflux disease without esophagitis: Secondary | ICD-10-CM

## 2024-03-11 DIAGNOSIS — K2970 Gastritis, unspecified, without bleeding: Secondary | ICD-10-CM

## 2024-03-11 DIAGNOSIS — K317 Polyp of stomach and duodenum: Secondary | ICD-10-CM

## 2024-03-11 DIAGNOSIS — K319 Disease of stomach and duodenum, unspecified: Secondary | ICD-10-CM | POA: Diagnosis not present

## 2024-03-11 DIAGNOSIS — K5730 Diverticulosis of large intestine without perforation or abscess without bleeding: Secondary | ICD-10-CM

## 2024-03-11 DIAGNOSIS — R12 Heartburn: Secondary | ICD-10-CM

## 2024-03-11 DIAGNOSIS — K529 Noninfective gastroenteritis and colitis, unspecified: Secondary | ICD-10-CM

## 2024-03-11 MED ORDER — SODIUM CHLORIDE 0.9 % IV SOLN: 500.0000 mL | Freq: Once | INTRAVENOUS | Status: DC

## 2024-03-11 NOTE — Patient Instructions (Addendum)
 Handout provided on Diverticulosis.  Education attached on gastritis.  Biopsy results will be sent via MyChart or letter.  Follow up appt scheduled.  If you need to reschedule, please contact the [facility].    YOU HAD AN ENDOSCOPIC PROCEDURE TODAY AT THE [facility]:   Refer to the procedure report that was given to you for any specific questions about what was found during the examination.  If the procedure report does not answer your questions, please call your gastroenterologist to clarify.  If you requested that your care partner not be given the details of your procedure findings, then the procedure report has been included in a sealed envelope for you to review at your convenience later.  YOU SHOULD EXPECT: Some feelings of bloating in the abdomen. Passage of more gas than usual.  Walking can help get rid of the air that was put into your GI tract during the procedure and reduce the bloating. If you had a lower endoscopy (such as a colonoscopy or flexible sigmoidoscopy) you may notice spotting of blood in your stool or on the toilet paper. If you underwent a bowel prep for your procedure, you may not have a normal bowel movement for a few days.  Please Note:  You might notice some irritation and congestion in your nose or some drainage.  This is from the oxygen used during your procedure.  There is no need for concern and it should clear up in a day or so.  SYMPTOMS TO REPORT IMMEDIATELY:  Following lower endoscopy (colonoscopy or flexible sigmoidoscopy):  Excessive amounts of blood in the stool  Significant tenderness or worsening of abdominal pains  Swelling of the abdomen that is new, acute  Fever of 100F or higher  Following upper endoscopy (EGD)  Vomiting of blood or coffee ground material  New chest pain or pain under the shoulder blades  Painful or persistently difficult swallowing  New shortness of breath  Fever of 100F or higher  Black, tarry-looking  stools  For urgent or emergent issues, a gastroenterologist can be reached at any hour by calling [phone]. Do not use MyChart messaging for urgent concerns.    DIET:  We do recommend a small meal at first, but then you may proceed to your regular diet.  Drink plenty of fluids but you should avoid alcoholic beverages for 24 hours.  ACTIVITY:  You should plan to take it easy for the rest of today and you should NOT DRIVE or use heavy machinery until tomorrow (because of the sedation medicines used during the test).    FOLLOW UP: Our staff will call the number listed on your records the next business day following your procedure.  We will call around 7:15- 8:00 am to check on you and address any questions or concerns that you may have regarding the information given to you following your procedure. If we do not reach you, we will leave a message.     If any biopsies were taken you will be contacted by phone or by letter within the next 1-3 weeks.  Please call us  at [phone] if you have not heard about the biopsies in 3 weeks.    SIGNATURES/CONFIDENTIALITY: You and/or your care partner have signed paperwork which will be entered into your electronic medical record.  These signatures attest to the fact that that the information above on your After Visit Summary has been reviewed and is understood.  Full responsibility of the confidentiality of this  discharge information lies with you and/or your care-partner.

## 2024-03-11 NOTE — Progress Notes (Signed)
1342 Robinul 0.1 mg IV given due large amount of secretions upon assessment.  MD made aware, vss

## 2024-03-11 NOTE — Progress Notes (Signed)
 Called to room to assist during endoscopic procedure.  Patient ID and intended procedure confirmed with present staff. Received instructions for my participation in the procedure from the performing physician.

## 2024-03-11 NOTE — Telephone Encounter (Signed)
 Patient status post EGD and colonoscopy earlier today, late afternoon cases.  Biopsies taken from both.  No cautery used.  Complaining of some transient episodes of sharp epigastric pain very fleeting it sounds like.  Has not had flatus since he arrived home.  Ate a sandwich.  No nausea or vomiting.  Temperature 99.  Advised to shift to a liquid diet and give this some time.  If things deteriorate with severe pain nausea vomiting true fever go to the emergency department.  I suspect he may just need to pass some more air.  Okay to take medications tonight.  Wife asked if she can go to work tomorrow and I said if he is back to baseline that would be fine.  Advised that we would contact the patient tomorrow to follow-up.

## 2024-03-11 NOTE — Op Note (Signed)
 [facility] Patient Name: Juan Esparza Procedure Date: 03/11/2024 1:33 PM MRN: [id number] Endoscopist: [name] Hausen , MD, [id number] Age: 43 Referring MD:  Date of Birth: [birth year] Gender: Male Account #: [id number] Procedure:                Colonoscopy Indications:              This is the patient's first colonoscopy, Abdominal                            pain, Chronic diarrhea Medicines:                Monitored Anesthesia Care Procedure:                Pre-Anesthesia Assessment:                           - Prior to the procedure, a History and Physical                            was performed, and patient medications and                            allergies were reviewed. The patient's tolerance of                            previous anesthesia was also reviewed. The risks                            and benefits of the procedure and the sedation                            options and risks were discussed with the patient.                            All questions were answered, and informed consent                            was obtained. Prior Anticoagulants: The patient has                            taken no anticoagulant or antiplatelet agents. ASA                            Grade Assessment: II - A patient with mild systemic                            disease. After reviewing the risks and benefits,                            the patient was deemed in satisfactory condition to                            undergo the procedure.  After obtaining informed consent, the colonoscope                            was passed under direct vision. Throughout the                            procedure, the patient's blood pressure, pulse, and                            oxygen saturations were monitored continuously. The                            CF [id number] #[id number] was introduced through the anus                            and advanced to the terminal ileum.  The colonoscopy                            was somewhat difficult due to restricted mobility                            of the colon. Successful completion of the                            procedure was aided by withdrawing the scope and                            replacing with the pediatric colonoscope. The                            patient tolerated the procedure well. The quality                            of the bowel preparation was good. The terminal                            ileum, ileocecal valve, appendiceal orifice, and                            rectum were photographed. Scope In: 2:05:53 PM Scope Out: 2:19:28 PM Scope Withdrawal Time: 0 hours 9 minutes 35 seconds  Total Procedure Duration: 0 hours 13 minutes 35 seconds  Findings:                 The perianal and digital rectal examinations were                            normal. Pertinent negatives include normal                            sphincter tone and no palpable rectal lesions.                           Normal mucosa was found in the entire colon.  Biopsies for histology were taken with a cold                            forceps for evaluation of microscopic colitis.                           Multiple large-mouthed, medium-mouthed and                            small-mouthed diverticula were found in the sigmoid                            colon, descending colon, transverse colon,                            ascending colon and cecum.                           The terminal ileum appeared normal.                           The retroflexed view of the distal rectum and anal                            verge was normal and showed no anal or rectal                            abnormalities. Complications:            No immediate complications. Estimated blood loss:                            Minimal. Estimated Blood Loss:     Estimated blood loss was minimal. Impression:               - Normal  mucosa in the entire examined colon.                            Biopsied.                           - Diverticulosis in the sigmoid colon, in the                            descending colon, in the transverse colon, in the                            ascending colon and in the cecum.                           - The examined portion of the ileum was normal.                           - The distal rectum and anal verge are normal on  retroflexion view. Recommendation:           - Discharge patient to home (ambulatory).                           - Await pathology results.                           - The findings and recommendations were discussed                            with the designated responsible adult.                           - Return to GI clinic in 4 - 8 weeks with Dr.                            Suzann or APP.                           - Repeat colonoscopy in 10 years for screening                            purposes.                           - Patient has a contact number available for                            emergencies. The signs and symptoms of potential                            delayed complications were discussed with the                            patient. Return to normal activities tomorrow.                            Written discharge instructions were provided to the                            patient. [name] Suzann, MD 03/11/2024 2:25:57 PM This report has been signed electronically.

## 2024-03-11 NOTE — Op Note (Signed)
 [facility] Patient Name: Juan Esparza Procedure Date: 03/11/2024 1:43 PM MRN: [id number] Endoscopist: [name] Hausen , MD, [id number] Age: 43 Referring MD:  Date of Birth: 16-Aug-1981 Gender: Male Account #: [id number] Procedure:                Upper GI endoscopy Indications:              Epigastric abdominal pain, Heartburn, Diarrhea,                            Nausea with vomiting Medicines:                Monitored Anesthesia Care Procedure:                Pre-Anesthesia Assessment:                           - Prior to the procedure, a History and Physical                            was performed, and patient medications and                            allergies were reviewed. The patient's tolerance of                            previous anesthesia was also reviewed. The risks                            and benefits of the procedure and the sedation                            options and risks were discussed with the patient.                            All questions were answered, and informed consent                            was obtained. Prior Anticoagulants: The patient has                            taken no anticoagulant or antiplatelet agents. ASA                            Grade Assessment: II - A patient with mild systemic                            disease. After reviewing the risks and benefits,                            the patient was deemed in satisfactory condition to                            undergo the procedure.  After obtaining informed consent, the endoscope was                            passed under direct vision. Throughout the                            procedure, the patient's blood pressure, pulse, and                            oxygen saturations were monitored continuously. The                            GIF [id number] #[id number] was introduced through the                            mouth, and advanced to the second part  of duodenum.                            The upper GI endoscopy was accomplished without                            difficulty. The patient tolerated the procedure                            well. Scope In: Scope Out: Findings:                 The examined esophagus was normal.                           Scattered mild inflammation characterized by                            erythema was found in the gastric body and in the                            gastric antrum. Biopsies were taken with a cold                            forceps for Helicobacter pylori testing.                           The cardia (on retroflexion) and gastric fundus (on                            retroflexion) were normal. A few scattered                            diminutive polyps were seen in the gastric fundus                            that had the appearance of fundic gland polyps.                           The duodenal bulb  and second portion of the                            duodenum were normal. Biopsies for histology were                            taken with a cold forceps for evaluation of celiac                            disease. Complications:            No immediate complications. Estimated blood loss:                            Minimal. Estimated Blood Loss:     Estimated blood loss was minimal. Impression:               - Normal esophagus.                           - Gastritis. Biopsied.                           - A few scattered diminutive polyps were seen in                            the gastric fundus that had the appearance of                            fundic gland polyps.                           - Normal cardia and gastric fundus.                           - Normal duodenal bulb and second portion of the                            duodenum. Biopsied. Recommendation:           - Await pathology results.                           - Perform a colonoscopy today.                           -  The findings and recommendations were discussed                            with the designated responsible adult. [name] Hausen, MD 03/11/2024 2:21:27 PM This report has been signed electronically.

## 2024-03-11 NOTE — Progress Notes (Signed)
 Report given to PACU, vss

## 2024-03-12 ENCOUNTER — Telehealth: Payer: Self-pay | Admitting: *Deleted

## 2024-03-12 NOTE — Telephone Encounter (Signed)
 Left message on f/u call

## 2024-03-12 NOTE — Telephone Encounter (Signed)
 Called pt again and was able to speak with him. He is back to work this morning and states he feels much better than he did when he called yesterday evening. Still reports mild discomfort in the upper abdomen above the umbilicus, rates it 3/10. Encouraged pt to ambulate, try GasX and can use heating pad if needed. Pt asked if he could eat this morning after going back to liquid diet yesterday. Instructed pt that if he is feeling better he can eat, but to start with bland breakfast foods to see how he feels. Pt verbalized understanding. States he will call back if pain worsens or he has any other concerns.

## 2024-03-16 ENCOUNTER — Ambulatory Visit: Payer: Self-pay | Admitting: Pediatrics

## 2024-03-16 LAB — SURGICAL PATHOLOGY

## 2024-03-20 ENCOUNTER — Ambulatory Visit (INDEPENDENT_AMBULATORY_CARE_PROVIDER_SITE_OTHER)

## 2024-03-20 ENCOUNTER — Ambulatory Visit
Admission: EM | Admit: 2024-03-20 | Discharge: 2024-03-20 | Disposition: A | Discharge status: Home / Self Care | Payer: Self-pay | Attending: Family Medicine | Admitting: Family Medicine

## 2024-03-20 ENCOUNTER — Ambulatory Visit: Payer: Self-pay | Admitting: Urgent Care

## 2024-03-20 DIAGNOSIS — M542 Cervicalgia: Secondary | ICD-10-CM | POA: Diagnosis not present

## 2024-03-20 DIAGNOSIS — M545 Low back pain, unspecified: Secondary | ICD-10-CM

## 2024-03-20 MED ORDER — IBUPROFEN 800 MG PO TABS: 800.0000 mg | ORAL_TABLET | Freq: Three times a day (TID) | ORAL | 0 refills | Status: DC

## 2024-03-20 MED ORDER — CYCLOBENZAPRINE HCL 5 MG PO TABS: 5.0000 mg | ORAL_TABLET | Freq: Every evening | ORAL | 0 refills | Status: AC | PRN
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
## 2024-03-23 ENCOUNTER — Encounter (HOSPITAL_COMMUNITY): Payer: Self-pay

## 2024-03-23 ENCOUNTER — Ambulatory Visit (HOSPITAL_COMMUNITY)
Admission: EM | Admit: 2024-03-23 | Discharge: 2024-03-23 | Disposition: A | Discharge status: Home / Self Care | Attending: Family Medicine | Admitting: Family Medicine

## 2024-03-23 DIAGNOSIS — M7918 Myalgia, other site: Secondary | ICD-10-CM

## 2024-03-23 MED ORDER — KETOROLAC TROMETHAMINE 30 MG/ML IJ SOLN
INTRAMUSCULAR | Status: AC
  Filled 2024-03-23: qty 1

## 2024-03-23 MED ORDER — KETOROLAC TROMETHAMINE 30 MG/ML IJ SOLN
30.0000 mg | Freq: Once | INTRAMUSCULAR | Status: AC
  Administered 2024-03-23: 30 mg via INTRAMUSCULAR
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
## 2024-03-23 NOTE — Discharge Instructions (Signed)
 Please continue to take your ibuprofen  and Flexeril  (muscle relaxer) as prescribed  Please seek medical attention if you have worsening symptoms or have any chest pain, shortness of breath, dizziness/lightheadedness, passing out, or vomiting  I recommend following up with a primary care doctor, as you may benefit from formal physical therapy

## 2024-04-19 ENCOUNTER — Ambulatory Visit

## 2024-04-19 VITALS — BP 141/95 | HR 64 | Temp 98.5°F | Resp 16 | Ht 66.0 in | Wt 150.6 lb

## 2024-04-19 DIAGNOSIS — M546 Pain in thoracic spine: Secondary | ICD-10-CM | POA: Diagnosis not present

## 2024-04-19 DIAGNOSIS — M542 Cervicalgia: Secondary | ICD-10-CM

## 2024-04-19 DIAGNOSIS — F32A Depression, unspecified: Secondary | ICD-10-CM | POA: Diagnosis not present

## 2024-04-19 DIAGNOSIS — Z7689 Persons encountering health services in other specified circumstances: Secondary | ICD-10-CM

## 2024-04-19 DIAGNOSIS — F419 Anxiety disorder, unspecified: Secondary | ICD-10-CM

## 2024-04-19 MED ORDER — IBUPROFEN 800 MG PO TABS: 800.0000 mg | ORAL_TABLET | Freq: Three times a day (TID) | ORAL | 0 refills | Status: AC
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
## 2024-05-20 ENCOUNTER — Ambulatory Visit

## 2024-05-20 VITALS — BP 132/95 | HR 97 | Temp 98.1°F | Resp 16 | Wt 149.2 lb

## 2024-05-20 DIAGNOSIS — M545 Low back pain, unspecified: Secondary | ICD-10-CM | POA: Insufficient documentation
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
## 2024-05-23 NOTE — Progress Notes (Deleted)
 [facility] Return Visit   Referring Provider No referring provider defined for this encounter.  Primary Care Provider Leavy Rode, Sula, PA-C  Patient Profile: Juan Esparza is a 43 y.o. male who returns to the [facility] for follow-up of the problem(s) noted below.  Problem List: GERD Nausea and vomiting Chronic diarrhea   History of Present Illness   Mr. Bedoya was last seen in the GI office 10/16/2023 by Elida Shawl, NP   Current GI Meds    Interval History   Discussed the use of AI scribe software for clinical note transcription with the patient, who gave verbal consent to proceed.  History of Present Illness Mr. Kendzierski is a 43 year old gentleman with a past medical history noteworthy for alcohol use disorder, seasonal allergies, anxiety and depression who returns to the gastroenterology office for follow-up of nausea, vomiting and chronic diarrhea  EGD/colonoscopy 03/2024 were reassuring with only evidence of mild chronic gastritis; colonoscopy was normal with pandiverticulosis and biopsies negative for IBD and microscopic colitis      GI Review of Symptoms Significant for {[id number]}. Otherwise negative.  General Review of Systems  Review of systems is significant for the pertinent positives and negatives as listed per the HPI.  Full ROS is otherwise negative.  Past Medical History   Past Medical History:  Diagnosis Date   Alcoholism (HCC)    Anxiety    Depression    GERD (gastroesophageal reflux disease)    Sleep apnea      Past Surgical History   Past Surgical History:  Procedure Laterality Date   NO PAST SURGERIES       Allergies and Medications   No Known Allergies @MEDSTODAY @  Family His   Family History  Problem Relation Age of Onset   Asthma Mother    Depression Mother    Stroke Mother    GER disease Mother    Hiatal hernia Mother    Alcohol abuse Father    Drug abuse  Father    Anuerysm Father        brain   Asthma Sister    Anxiety disorder Sister    Heart failure Maternal Uncle        x 2 uncles   Diabetes Maternal Grandmother    Diabetes Paternal Grandmother    Lung cancer Paternal Grandfather    Colon cancer Neg Hx    Rectal cancer Neg Hx    Stomach cancer Neg Hx    Esophageal cancer Neg Hx    GI Specific Family History: {gifamhx:50061}   Social History   Social History   Tobacco Use   Smoking status: Former    Current packs/day: 0.00    Average packs/day: 0.5 packs/day for 2.0 years (1.0 ttl pk-yrs)    Types: Cigarettes    Start date: 2019    Quit date: 2021    Years since quitting: 4.7   Smokeless tobacco: Never  Vaping Use   Vaping status: Some Days  Substance Use Topics   Alcohol use: Yes    Alcohol/week: 28.0 standard drinks of alcohol    Types: 28 Cans of beer per week    Comment: 2- 3 beers/day   Drug use: No   Marcia reports that he quit smoking about 4 years ago. His smoking use included cigarettes. He started smoking about 6 years ago. He has a 1 pack-year smoking history. He has never used smokeless tobacco. He reports current alcohol use of about 28.0 standard drinks of  alcohol per week. He reports that he does not use drugs.  Vital Signs and Physical Examination   There were no vitals filed for this visit. There is no height or weight on file to calculate BMI.    General: Well developed, well nourished, no acute distress Head: Normocephalic and atraumatic Eyes: Sclerae anicteric, EOMI Ears: Normal auditory acuity Mouth: No deformities or lesions noted Lungs: Clear throughout to auscultation Heart: Regular rate and rhythm; No murmurs, rubs or bruits Abdomen: Soft, non tender and non distended. No masses, hepatosplenomegaly or hernias noted. Normal Bowel sounds Rectal: Musculoskeletal: Symmetrical with no gross deformities  Pulses:  Normal pulses noted Extremities: No edema or deformities noted Neurological:  Alert oriented x 4, grossly nonfocal Psychological:  Alert and cooperative. Normal mood and affect   Review of Data   The following data was reviewed at the time of this encounter:   Laboratory Studies      Latest Ref Rng & Units 10/27/2023    7:55 AM 10/26/2023    1:49 PM 10/16/2023    2:32 PM  CBC  WBC 4.0 - 10.5 K/uL 6.9  11.8  7.5   Hemoglobin 13.0 - 17.0 g/dL 86.4  84.9  87.6   Hematocrit 39.0 - 52.0 % 41.7  46.6  37.8   Platelets 150 - 400 K/uL 237  286  282.0     Lab Results  Component Value Date   LIPASE 30 10/27/2023      Latest Ref Rng & Units 10/27/2023    7:55 AM 10/26/2023    1:49 PM 10/16/2023    2:32 PM  CMP  Glucose 70 - 99 mg/dL 862  842  885   BUN 6 - 20 mg/dL 12  17  13    Creatinine 0.61 - 1.24 mg/dL 8.57  9.03  9.02   Sodium 135 - 145 mmol/L 134  136  139   Potassium 3.5 - 5.1 mmol/L 3.7  3.6  3.6   Chloride 98 - 111 mmol/L 101  99  104   CO2 22 - 32 mmol/L 23  22  25    Calcium  8.9 - 10.3 mg/dL 9.3  89.8  8.9   Total Protein 6.5 - 8.1 g/dL 7.6  9.4  7.3   Total Bilirubin 0.0 - 1.2 mg/dL 0.8  1.4  0.8   Alkaline Phos 38 - 126 U/L 46  65  54   AST 15 - 41 U/L 27  27  19    ALT 0 - 44 U/L 26  31  20       Imaging Studies  CTAP 10/26/2023 1. Gas fluid levels throughout the colon consistent with history of diarrhea. No bowel obstruction or ileus. 2. Distal colonic diverticulosis without diverticulitis.   RUQ ultrasound 06/24/2023 No cholelithiasis or sonographic evidence for acute cholecystitis  GI Procedures and Studies  EGD/colonoscopy 03/11/2024 EGD -normal esophagus, gastritis, scattered diminutive polyps suggestive of fundic gland polyps, normal duodenum Colonoscopy-diverticulosis throughout the entire colon, otherwise normal colon and terminal ileum  Path: Reactive gastropathy with minimal chronic gastritis H. pylori negative, normal colonic and duodenal mucosa  Clinical Impression  It is my clinical impression that Mr. Kobler is a 43  y.o. male with;  ***  Plan  *** *** *** *** ***   Planned Follow Up No follow-ups on file.  The patient or caregiver verbalized understanding of the material covered, with no barriers to understanding. All questions were answered. Patient or caregiver is agreeable with the plan outlined above.  It was a pleasure to see Mykael.  If you have any questions or concerns regarding this evaluation, do not hesitate to contact me.  [name] [id number] [facility]

## 2024-05-24 ENCOUNTER — Ambulatory Visit: Admitting: Pediatrics

## 2024-05-26 NOTE — Progress Notes (Unsigned)
 Psychiatric Initial Adult Assessment   Patient Identification: Juan Esparza MRN:  [id number] Date of Evaluation:  05/26/2024 Referral Source: Primary care provider Chief Complaint:  No chief complaint on file.  Visit Diagnosis: No diagnosis found.   Assessment:  Juan Esparza is a 43 y.o. male with a history of anxiety, depression who presents in person to [facility] at [facility] for initial evaluation on 05/26/2024.    At initial evaluation patient reports ***  A number of assessments were performed during the evaluation today including  PHQ-9 which they scored a *** on, GAD-7 which they scored a *** on, and [place] suicide severity screening which showed ***.    Risk Assessment: A suicide and violence risk assessment was performed as part of this evaluation. There patient is deemed to be at chronic elevated risk for self-harm/suicide given the following factors: N/A. These risk factors are mitigated by the following factors: lack of active SI/HI, no known access to weapons or firearms, no history of previous suicide attempts, and no history of violence. The patient is deemed to be at chronic elevated risk for violence given the following factors: N/A. These risk factors are mitigated by the following factors: no known history of violence towards others, no known violence towards others in the last 6 months, no known history of threats of harm towards others, no known homicidal ideation in the last 6 months, no command hallucinations to harm others in the last 6 months, and no active symptoms of psychosis. There is no acute risk for suicide or violence at this time. The patient was educated about relevant modifiable risk factors including following recommendations for treatment of psychiatric illness and abstaining from substance abuse.  While future psychiatric events cannot be accurately predicted, the patient does not currently require  acute inpatient psychiatric  care and does not currently meet [place]  involuntary commitment criteria.  Patient was given contact information for crisis resources, behavioral health clinic and was instructed to call 911 for emergencies.    Plan: # MDD without psychotic features Past medication trials: Trazodone  Status of problem: Current Interventions: -- ***  # GAD Past medication trials: Trazodone  Status of problem: Current Interventions: -- ***  # *** Past medication trials:  Status of problem: *** Interventions: -- ***   History of Present Illness:  ***  Associated Signs/Symptoms: Depression Symptoms:  {DEPRESSION SYMPTOMS:20000} (Hypo) Manic Symptoms:  {BHH MANIC SYMPTOMS:22872} Anxiety Symptoms:  {BHH ANXIETY SYMPTOMS:22873} Psychotic Symptoms:  {BHH PSYCHOTIC SYMPTOMS:22874} PTSD Symptoms: {BHH PTSD SYMPTOMS:22875}  Past Psychiatric History:  Past psychiatric diagnoses: Anxiety, depression Psychiatric hospitalizations: None Past suicide attempts: Denies Hx of self harm: Denies Hx of violence towards others: Denies Prior psychiatric providers: None Prior therapy: None Access to firearms: Denies  Prior medication trials: Trazodone   Substance use: Denies  Past Medical History:  Past Medical History:  Diagnosis Date   Alcoholism (HCC)    Anxiety    Depression    GERD (gastroesophageal reflux disease)    Sleep apnea     Past Surgical History:  Procedure Laterality Date   NO PAST SURGERIES      Family Psychiatric History: Nothing significant  Family History:  Family History  Problem Relation Age of Onset   Asthma Mother    Depression Mother    Stroke Mother    GER disease Mother    Hiatal hernia Mother    Alcohol abuse Father    Drug abuse Father    Anuerysm Father  brain   Asthma Sister    Anxiety disorder Sister    Heart failure Maternal Uncle        x 2 uncles   Diabetes Maternal Grandmother    Diabetes Paternal Grandmother    Lung cancer Paternal  Grandfather    Colon cancer Neg Hx    Rectal cancer Neg Hx    Stomach cancer Neg Hx    Esophageal cancer Neg Hx     Social History:   Social History   Socioeconomic History   Marital status: Single    Spouse name: Not on file   Number of children: 1   Years of education: Not on file   Highest education level: Not on file  Occupational History    Employer: [employer]  Tobacco Use   Smoking status: Former    Current packs/day: 0.00    Average packs/day: 0.5 packs/day for 2.0 years (1.0 ttl pk-yrs)    Types: Cigarettes    Start date: 2019    Quit date: 2021    Years since quitting: 4.7   Smokeless tobacco: Never  Vaping Use   Vaping status: Some Days  Substance and Sexual Activity   Alcohol use: Yes    Alcohol/week: 28.0 standard drinks of alcohol    Types: 28 Cans of beer per week    Comment: 2- 3 beers/day   Drug use: No   Sexual activity: Yes    Partners: Female  Other Topics Concern   Not on file  Social History Narrative   Not on file   Social Drivers of Health   Financial Resource Strain: Medium Risk (04/19/2024)   Overall Financial Resource Strain (CARDIA)    Difficulty of Paying Living Expenses: Somewhat hard  Food Insecurity: Food Insecurity Present (04/19/2024)   Hunger Vital Sign    Worried About Running Out of Food in the Last Year: Sometimes true    Ran Out of Food in the Last Year: Sometimes true  Transportation Needs: No Transportation Needs (04/19/2024)   PRAPARE - Administrator, Civil Service (Medical): No    Lack of Transportation (Non-Medical): No  Physical Activity: Insufficiently Active (04/19/2024)   Exercise Vital Sign    Days of Exercise per Week: 1 day    Minutes of Exercise per Session: 10 min  Stress: No Stress Concern Present (04/19/2024)   [employer] of Occupational Health - Occupational Stress Questionnaire    Feeling of Stress: Not at all  Social Connections: Moderately Isolated (04/19/2024)   Social Connection and  Isolation Panel    Frequency of Communication with Friends and Family: Twice a week    Frequency of Social Gatherings with Friends and Family: Twice a week    Attends Religious Services: 1 to 4 times per year    Active Member of [employer] or Organizations: No    Attends Banker Meetings: Never    Marital Status: Never married    Additional Social History: ***  Allergies:  No Known Allergies  Metabolic Disorder Labs: Lab Results  Component Value Date   HGBA1C 6.6 (H) 07/11/2023   MPG 143 07/11/2023   No results found for: PROLACTIN Lab Results  Component Value Date   CHOL 252 (H) 02/19/2022   TRIG 140.0 02/19/2022   HDL 72.50 02/19/2022   CHOLHDL 3 02/19/2022   VLDL 28.0 02/19/2022   LDLCALC 152 (H) 02/19/2022   LDLCALC 95 05/22/2020   Lab Results  Component Value Date   TSH 0.97 10/16/2023  Therapeutic Level Labs: No results found for: LITHIUM No results found for: CBMZ No results found for: VALPROATE  Current Medications: Current Outpatient Medications  Medication Sig Dispense Refill   acamprosate  (CAMPRAL ) 333 MG tablet Take 2 tablets (666 mg total) by mouth 3 (three) times daily. (Patient not taking: No sig reported) 180 tablet 2   albuterol  (VENTOLIN  HFA) 108 (90 Base) MCG/ACT inhaler Inhale 2 puffs into the lungs every 6 (six) hours as needed for wheezing or shortness of breath (Cough). (Patient not taking: Reported on 05/20/2024) 18 g 0   benzonatate  (TESSALON ) 100 MG capsule Take 1 capsule (100 mg total) by mouth 3 (three) times daily as needed for cough. 30 capsule 0   brompheniramine-pseudoephedrine-DM 30-2-10 MG/5ML syrup Take 5 mLs by mouth 4 (four) times daily as needed. 120 mL 0   cyclobenzaprine  (FLEXERIL ) 5 MG tablet Take 1 tablet (5 mg total) by mouth at bedtime as needed. 30 tablet 0   fluticasone  (FLONASE ) 50 MCG/ACT nasal spray Place 1 spray into both nostrils daily. Begin by using 2 sprays in each nare daily for 3 to 5 days, then  decrease to 1 spray in each nare daily. 15.8 mL 2   hydrOXYzine  (VISTARIL ) 25 MG capsule Take 1 capsule (25 mg total) by mouth at bedtime as needed. 30 capsule 6   Olopatadine  HCl (PATADAY ) 0.2 % SOLN Apply 1 drop to eye daily. 2.5 mL 1   omeprazole  (PRILOSEC) 40 MG capsule Take 1 capsule (40 mg total) by mouth daily. 90 capsule 3   ondansetron  (ZOFRAN -ODT) 4 MG disintegrating tablet Take 1 tablet (4 mg total) by mouth every 8 (eight) hours as needed. Dissolve under tongue every 8 hours as needed. 30 tablet 0   sildenafil  (VIAGRA ) 50 MG tablet Take 1-2 tablets (50-100 mg total) by mouth daily as needed for erectile dysfunction. 30 tablet 5   tiZANidine  (ZANAFLEX ) 4 MG tablet Take 1 tablet (4 mg total) by mouth at bedtime. 30 tablet 0   traZODone  (DESYREL ) 100 MG tablet Take 1 tablet (100 mg total) by mouth at bedtime as needed. 90 tablet 3   trimethoprim -polymyxin b  (POLYTRIM ) ophthalmic solution Place 1 drop into the right eye every 4 (four) hours. 10 mL 0   No current facility-administered medications for this visit.    Musculoskeletal: Strength & Muscle Tone: within normal limits Gait & Station: normal Patient leans: N/A  Psychiatric Specialty Exam:  Psychiatric Specialty Exam: There were no vitals taken for this visit.There is no height or weight on file to calculate BMI. Review of Systems  General Appearance: Casual and Fairly Groomed  Eye Contact:  Good  Speech:  Clear and Coherent  Volume:  Normal  Mood:  Euthymic  Affect:  Congruent  Thought Content: Logical   Suicidal Thoughts:  No  Homicidal Thoughts:  No  Thought Process:  Coherent  Orientation:  Full (Time, Place, and Person)    Memory: Immediate;   Good Recent;   Good Remote;   Good  Judgment:  Fair  Insight:  Fair  Concentration:  Concentration: Good and Attention Span: Good  Recall:  not formally assessed   Fund of Knowledge: Good  Language: Good  Psychomotor Activity:  Normal  Akathisia:  No  AIMS (if  indicated): not done  Assets:  Communication Skills Desire for Improvement Financial Resources/Insurance Housing Transportation Vocational/Educational  ADL's:  Intact  Cognition: WNL  Sleep:  Fair    Screenings: GAD-7    Flowsheet Row Office Visit from 05/20/2024 in  [place] [facility]  Total GAD-7 Score 0   PHQ2-9    Flowsheet Row Office Visit from 05/20/2024 in [facility] at [facility] Office Visit from 04/19/2024 in [facility] at [facility] Office Visit from 06/13/2023 in [facility] Visit from 02[facility]19/2022 in [facility] [place] [facility] Visit from 12/25/2020 in [facility] at [facility] Total Score 0 4 1 0 1  PHQ-9 Total Score 0 6 -- -- --   Flowsheet Row UC from 03/23/2024 in [facility] UC from 03[facility]20/2024 in [facility] ED from 10/27/2023 in [facility] at [facility]  C-SSRS RISK CATEGORY No Risk No Risk No Risk     Collaboration of Care: Other ***  Patient/Guardian was advised Release of Information must be obtained prior to any record release in order to collaborate their care with an outside provider. Patient/Guardian was advised if they have not already done so to contact the registration department to sign all necessary forms in order for us  to release information regarding their care.   Consent: Patient/Guardian gives verbal consent for treatment and assignment of benefits for services provided during this visit. Patient/Guardian expressed understanding and agreed to proceed.   Josanna Hefel, MD 9/24/202510:19 AM

## 2024-06-09 ENCOUNTER — Encounter (HOSPITAL_COMMUNITY): Payer: Self-pay

## 2024-06-09 ENCOUNTER — Ambulatory Visit (HOSPITAL_COMMUNITY): Payer: Self-pay

## 2024-06-09 VITALS — BP 137/95 | HR 85 | Ht 66.0 in | Wt 150.0 lb

## 2024-06-09 DIAGNOSIS — F411 Generalized anxiety disorder: Secondary | ICD-10-CM | POA: Diagnosis not present

## 2024-06-09 DIAGNOSIS — F321 Major depressive disorder, single episode, moderate: Secondary | ICD-10-CM | POA: Diagnosis not present

## 2024-06-09 DIAGNOSIS — F5104 Psychophysiologic insomnia: Secondary | ICD-10-CM

## 2024-06-09 MED ORDER — QUETIAPINE FUMARATE 50 MG PO TABS: 50.0000 mg | ORAL_TABLET | Freq: Every day | ORAL | 0 refills | Status: AC

## 2024-06-09 MED ORDER — TRAZODONE HCL 100 MG PO TABS: 100.0000 mg | ORAL_TABLET | Freq: Every evening | ORAL | 3 refills | Status: DC | PRN

## 2024-06-09 MED ORDER — FLUOXETINE HCL 10 MG PO CAPS: ORAL_CAPSULE | ORAL | 0 refills | Status: DC

## 2024-06-29 ENCOUNTER — Ambulatory Visit

## 2024-06-30 NOTE — Progress Notes (Addendum)
 Psychiatric Follow Up  Patient Identification: Juan Esparza MRN:  [id number] Date of Evaluation:  07/07/2024 Referral Source: Primary care provider Chief Complaint:   Chief Complaint  Patient presents with   Follow-up   Depression   Anxiety   Medication Refill   Visit Diagnosis:    ICD-10-CM   1. Current moderate episode of major depressive disorder, unspecified whether recurrent (HCC)  F32.1     2. Chronic insomnia  F51.04     3. GAD (generalized anxiety disorder)  F41.1     4. Alcohol use disorder, mild, abuse  F10.10 Comprehensive Metabolic Panel (CMET)    B12 and Folate Panel    CBC with Differential    TSH        Assessment:  Juan Esparza is a 43 y.o. male with a history of anxiety, depression who presents in person to [facility] for follow up on 07/07/2024.    Today, patient continues to feel depressed and anxious due to the psychosocial stressors that include losing his uncle, his dog, chronic pain in the back due to the accident, financial constraints.  Low energy, concentration reduction, appetite disturbances, disturbed sleep have continued since the last visit since he has not taken his medications as prescribed.  He has also continued to drink a bottle of champagne every day before bedtime.  Though physical pain is contributing to his symptoms, limited insight into his condition has been preventing him to be compliant with his medications.  We discussed sleep hygiene, encouraged compliance.  We also discussed the risk benefits and side effects of all his medications today.  He was contemplative to quit alcohol, encouraged/motivated to start naltrexone for alcohol cravings.  Also encouraged him to reach out to [facility] for substance use counseling.  Will continue the same medications as prescribed in the previous visit and at naltrexone for alcohol cessation.  He has enough medication until the next appointment, no other refills were  sent.  Also ordered blood work for him prior to starting naltrexone, patient updated about the plan.  Will have him back in the clinic in 4 weeks.   Risk Assessment: A suicide and violence risk assessment was performed as part of this evaluation. There patient is deemed to be at chronic elevated risk for self-harm/suicide given the following factors: N/A. These risk factors are mitigated by the following factors: lack of active SI/HI, no known access to weapons or firearms, no history of previous suicide attempts, and no history of violence. The patient is deemed to be at chronic elevated risk for violence given the following factors: N/A. These risk factors are mitigated by the following factors: no known history of violence towards others, no known violence towards others in the last 6 months, no known history of threats of harm towards others, no known homicidal ideation in the last 6 months, no command hallucinations to harm others in the last 6 months, and no active symptoms of psychosis. There is no acute risk for suicide or violence at this time. The patient was educated about relevant modifiable risk factors including following recommendations for treatment of psychiatric illness and abstaining from substance abuse.  While future psychiatric events cannot be accurately predicted, the patient does not currently require  acute inpatient psychiatric care and does not currently meet [facility]  involuntary commitment criteria.  Patient was given contact information for crisis resources, behavioral health clinic and was instructed to call 911 for emergencies.    Plan: # MDD  without psychotic features Past medication trials: Trazodone  Status of problem: Current Interventions: -- Start Prozac 10 mg for 2 weeks, increase it to 20 mg daily until the next appointment -- Continue trazodone  100 mg nightly for sleep, we will taper it down in the upcoming visit -- Start Seroquel 50 mg nightly for  sleep -- Can consider gabapentin  in future if no/minimal benefit with the current regimen  # GAD Past medication trials: Trazodone  Status of problem: Current Interventions: -- Start Prozac as above  # AUD Past medication trials: None Status of problem: Active Interventions: -- Reached out to [facility] for alcohol abuse counseling/therapy, awaiting follow-up -- Patient is currently contemplative -- Start naltrexone 50 mg daily  -- Ordered CMP, CBC, TSH, folate and vitamin B12 levels.   History of Present Illness:    Juan Esparza is a 43 year old male with a history of insomnia, depression, anxiety, alcohol use disorder that presented to the clinic today for follow-up for depression and anxiety. Patient reported his mood as okay .  Reported that I have been still dealing with a lot , reported that he did not take his medicines I got scared, going to change my personality .  He continues to report symptoms of low energy, depressed mood, decreased interest in daily activities, decreased concentration, reduced appetite and disturbed sleep.  Reported that he has been trying to eat more, has been having fragmented sleep each night, denies any nightmares or flashbacks.  Reported trazodone  does not help much with sleep however he has not tried Seroquel.  He denied any active or passive SI/HI/AVH.  He reported that he continues to drink a bottle of wine every day in the nights, encouraged about quitting, reaching out and following up with [facility] for substance use counseling, patient is currently contemplative, inquired about starting naltrexone.  Discussed the risk benefits and side effects of medications.  He denied smoking cigarettes or using any other substances.  Encouraged him to start taking Prozac, Seroquel and naltrexone.  Encouraged compliance.  Reiterated to him all the risk benefits and side effects of his medications.  Plan updated to taper down trazodone  in the subsequent visits if  adequate benefit with Seroquel.  Plan to have him back in the clinic in 4 weeks.  Associated Signs/Symptoms: Depression Symptoms:  depressed mood, insomnia, fatigue, difficulty concentrating, anxiety, loss of energy/fatigue, decreased appetite, (Hypo) Manic Symptoms:  None Anxiety Symptoms:  Excessive Worry, Psychotic Symptoms:  None PTSD Symptoms: Negative  Past Psychiatric History:  Past psychiatric diagnoses: Anxiety, depression, Chronic insomnia, AUD Psychiatric hospitalizations: None Past suicide attempts: Denies Hx of self harm: Denies Hx of violence towards others: Denies Prior psychiatric providers: None Prior therapy: None Access to firearms: Denies  Prior medication trials: Trazodone  100 mg  Substance use: Alcohol, current, quit using marijuana 6 years ago  Past Medical History:  Past Medical History:  Diagnosis Date   Alcoholism (HCC)    Anxiety    Depression    GERD (gastroesophageal reflux disease)    Sleep apnea     Past Surgical History:  Procedure Laterality Date   NO PAST SURGERIES      Family Psychiatric History: His father suffered from depression Family History:  Family History  Problem Relation Age of Onset   Asthma Mother    Depression Mother    Stroke Mother    GER disease Mother    Hiatal hernia Mother    Alcohol abuse Father    Drug abuse Father    Anuerysm Father  brain   Asthma Sister    Anxiety disorder Sister    Heart failure Maternal Uncle        x 2 uncles   Diabetes Maternal Grandmother    Diabetes Paternal Grandmother    Lung cancer Paternal Grandfather    Colon cancer Neg Hx    Rectal cancer Neg Hx    Stomach cancer Neg Hx    Esophageal cancer Neg Hx     Social History:   Social History   Socioeconomic History   Marital status: Single    Spouse name: Not on file   Number of children: 1   Years of education: Not on file   Highest education level: Not on file  Occupational History    Employer: [employer]   Tobacco Use   Smoking status: Former    Current packs/day: 0.00    Average packs/day: 0.5 packs/day for 2.0 years (1.0 ttl pk-yrs)    Types: Cigarettes    Start date: 2019    Quit date: 2021    Years since quitting: 4.8   Smokeless tobacco: Never  Vaping Use   Vaping status: Some Days  Substance and Sexual Activity   Alcohol use: Yes    Alcohol/week: 28.0 standard drinks of alcohol    Types: 28 Cans of beer per week    Comment: 2- 3 beers/day   Drug use: No   Sexual activity: Yes    Partners: Female    Birth control/protection: None  Other Topics Concern   Not on file  Social History Narrative   Not on file   Social Drivers of Health   Financial Resource Strain: Medium Risk (04/19/2024)   Overall Financial Resource Strain (CARDIA)    Difficulty of Paying Living Expenses: Somewhat hard  Food Insecurity: Food Insecurity Present (04/19/2024)   Hunger Vital Sign    Worried About Running Out of Food in the Last Year: Sometimes true    Ran Out of Food in the Last Year: Sometimes true  Transportation Needs: No Transportation Needs (04/19/2024)   PRAPARE - Administrator, Civil Service (Medical): No    Lack of Transportation (Non-Medical): No  Physical Activity: Insufficiently Active (04/19/2024)   Exercise Vital Sign    Days of Exercise per Week: 1 day    Minutes of Exercise per Session: 10 min  Stress: No Stress Concern Present (04/19/2024)   [employer] of Occupational Health - Occupational Stress Questionnaire    Feeling of Stress: Not at all  Social Connections: Moderately Isolated (04/19/2024)   Social Connection and Isolation Panel    Frequency of Communication with Friends and Family: Twice a week    Frequency of Social Gatherings with Friends and Family: Twice a week    Attends Religious Services: 1 to 4 times per year    Active Member of [employer] or Organizations: No    Attends Banker Meetings: Never    Marital Status: Never married     Additional Social History: He is currently on disability, worked as a pension scheme manager.  Allergies:  No Known Allergies  Metabolic Disorder Labs: Lab Results  Component Value Date   HGBA1C 6.6 (H) 07/11/2023   MPG 143 07/11/2023   No results found for: PROLACTIN Lab Results  Component Value Date   CHOL 252 (H) 02/19/2022   TRIG 140.0 02/19/2022   HDL 72.50 02/19/2022   CHOLHDL 3 02/19/2022   VLDL 28.0 02/19/2022   LDLCALC 152 (H) 02/19/2022   LDLCALC  95 05/22/2020   Lab Results  Component Value Date   TSH 0.97 10/16/2023    Therapeutic Level Labs: No results found for: LITHIUM No results found for: CBMZ No results found for: VALPROATE  Current Medications: Current Outpatient Medications  Medication Sig Dispense Refill   naltrexone (DEPADE) 50 MG tablet Take 1 tablet (50 mg total) by mouth daily. 30 tablet 0   acamprosate  (CAMPRAL ) 333 MG tablet Take 2 tablets (666 mg total) by mouth 3 (three) times daily. (Patient not taking: No sig reported) 180 tablet 2   albuterol  (VENTOLIN  HFA) 108 (90 Base) MCG/ACT inhaler Inhale 2 puffs into the lungs every 6 (six) hours as needed for wheezing or shortness of breath (Cough). (Patient not taking: No sig reported) 18 g 0   amoxicillin -clavulanate (AUGMENTIN) 875-125 MG tablet Take 1 tablet by mouth 2 (two) times daily for 7 days. 14 tablet 0   cyclobenzaprine  (FLEXERIL ) 5 MG tablet Take 1 tablet (5 mg total) by mouth at bedtime as needed. 30 tablet 0   FLUoxetine (PROZAC) 10 MG capsule Take 1 capsule (10 mg total) by mouth daily for 15 days, THEN 2 capsules (20 mg total) daily for 15 days. 45 capsule 0   fluticasone  (FLONASE ) 50 MCG/ACT nasal spray Place 1 spray into both nostrils daily. Begin by using 2 sprays in each nare daily for 3 to 5 days, then decrease to 1 spray in each nare daily. 15.8 mL 2   hydrOXYzine  (VISTARIL ) 25 MG capsule Take 1 capsule (25 mg total) by mouth at bedtime as needed. 30 capsule 6   ketorolac   (TORADOL ) 10 MG tablet Take 1 tablet (10 mg total) by mouth every 6 (six) hours as needed (pain). 20 tablet 0   Olopatadine  HCl (PATADAY ) 0.2 % SOLN Apply 1 drop to eye daily. 2.5 mL 1   omeprazole  (PRILOSEC) 40 MG capsule Take 1 capsule (40 mg total) by mouth daily. 90 capsule 3   QUEtiapine (SEROQUEL) 50 MG tablet Take 1 tablet (50 mg total) by mouth at bedtime. 30 tablet 0   sildenafil  (VIAGRA ) 50 MG tablet Take 1-2 tablets (50-100 mg total) by mouth daily as needed for erectile dysfunction. 30 tablet 5   tiZANidine  (ZANAFLEX ) 4 MG tablet Take 1 tablet (4 mg total) by mouth at bedtime. 30 tablet 0   traZODone  (DESYREL ) 100 MG tablet Take 1 tablet (100 mg total) by mouth at bedtime as needed. 90 tablet 3   No current facility-administered medications for this visit.    Musculoskeletal: Strength & Muscle Tone: within normal limits Gait & Station: normal Patient leans: N/A  Psychiatric Specialty Exam:  Psychiatric Specialty Exam: Blood pressure 137/87, pulse 89, height 5' 6 (1.676 m), weight 150 lb (68 kg).Body mass index is 24.21 kg/m. Review of Systems  General Appearance: Casual and Fairly Groomed  Eye Contact:  Good  Speech:  Clear and Coherent  Volume:  Normal  Mood:  Euthymic  Affect:  Congruent  Thought Content: Logical   Suicidal Thoughts:  No  Homicidal Thoughts:  No  Thought Process:  Coherent  Orientation:  Full (Time, Place, and Person)    Memory: Immediate;   Good Recent;   Good Remote;   Good  Judgment:  Fair  Insight:  Fair  Concentration:  Concentration: Good and Attention Span: Good  Recall:  not formally assessed   Fund of Knowledge: Good  Language: Good  Psychomotor Activity:  Normal  Akathisia:  No  AIMS (if indicated): not done  Assets:  Communication Skills Desire for Improvement Interior And Spatial Designer Vocational/Educational  ADL's:  Intact  Cognition: WNL  Sleep:  Fair    Screenings: GAD-7    Flowsheet  Row [facility] Office Visit from 05/20/2024 in [facility]  Total GAD-7 Score 18 0   PHQ2-9    Flowsheet Row Office Visit from 06/09/2024 in [facility] Office Visit from 05/20/2024 in [facility] at [facility] Office Visit from 04/19/2024 in [facility] Visit from 06/13/2023 in [facility] Visit from 02/19/2022 in [facility] Total Score 6 0 4 1 0  PHQ-9 Total Score 21 0 6 -- --   Flowsheet Row UC from 07/06/2024 in [facility] UC from 03/23/2024 in [facility] UC from 03/20/2024 in [facility] at [facility])  C-SSRS RISK CATEGORY No Risk No Risk No Risk     Collaboration of Care: Other Dr. Mercy LEOS Notes  Patient/Guardian was advised Release of Information must be obtained prior to any record release in order to collaborate their care with an outside provider. Patient/Guardian was advised if they have not already done so to contact the registration department to sign all necessary forms in order for us  to release information regarding their care.   Consent: Patient/Guardian gives verbal consent for treatment and assignment of benefits for services provided during this visit. Patient/Guardian expressed understanding and agreed to proceed.   Shekina Cordell, MD 11/5/20254:50 PM

## 2024-07-06 ENCOUNTER — Encounter (HOSPITAL_COMMUNITY): Payer: Self-pay | Admitting: *Deleted

## 2024-07-06 ENCOUNTER — Ambulatory Visit (HOSPITAL_COMMUNITY)
Admission: EM | Admit: 2024-07-06 | Discharge: 2024-07-06 | Disposition: A | Discharge status: Home / Self Care | Attending: Family Medicine | Admitting: Family Medicine

## 2024-07-06 DIAGNOSIS — K047 Periapical abscess without sinus: Secondary | ICD-10-CM | POA: Diagnosis not present

## 2024-07-06 MED ORDER — KETOROLAC TROMETHAMINE 30 MG/ML IJ SOLN
30.0000 mg | Freq: Once | INTRAMUSCULAR | Status: AC
Start: 2024-07-06 — End: 2024-07-06
  Administered 2024-07-06: 30 mg via INTRAMUSCULAR

## 2024-07-06 MED ORDER — KETOROLAC TROMETHAMINE 30 MG/ML IJ SOLN
INTRAMUSCULAR | Status: AC
  Filled 2024-07-06: qty 1

## 2024-07-06 MED ORDER — AMOXICILLIN-POT CLAVULANATE 875-125 MG PO TABS: 1.0000 | ORAL_TABLET | Freq: Two times a day (BID) | ORAL | 0 refills | Status: AC

## 2024-07-06 MED ORDER — KETOROLAC TROMETHAMINE 10 MG PO TABS: 10.0000 mg | ORAL_TABLET | Freq: Four times a day (QID) | ORAL | 0 refills | Status: AC | PRN

## 2024-07-06 NOTE — ED Provider Notes (Signed)
 [facility]    CSN: [id number] Arrival date & time: 07/06/24  1243      History   Chief Complaint Chief Complaint  Patient presents with   Dental Pain    HPI Juan Esparza is a 43 y.o. male.    Dental Pain  Here for pain in his left upper posterior teeth.  Began bothering him on November 2.  No fever or drainage.  NKDA  He does have a dentist and is working on getting the broken teeth fixed. Past Medical History:  Diagnosis Date   Alcoholism (HCC)    Anxiety    Depression    GERD (gastroesophageal reflux disease)    Sleep apnea     Patient Active Problem List   Diagnosis Date Noted   Bilateral low back pain without sciatica 05/20/2024   Hyperglycemia 07/11/2023   Erectile dysfunction 06/13/2023   Seasonal allergic rhinitis due to pollen 06/13/2023   Snoring 06/13/2023   Chronic insomnia 12/25/2020   Anxiety and depression 12/25/2020   Seasonal allergies 12/25/2020   Alcohol use disorder, mild, abuse 04/27/2020   GERD (gastroesophageal reflux disease) 11/11/2019   Chronic diarrhea 11/11/2019    Past Surgical History:  Procedure Laterality Date   NO PAST SURGERIES         Home Medications    Prior to Admission medications   Medication Sig Start Date End Date Taking? Authorizing Provider  amoxicillin -clavulanate (AUGMENTIN) 875-125 MG tablet Take 1 tablet by mouth 2 (two) times daily for 7 days. 07/06/24 07/13/24 Yes Sutton Hirsch K, MD  FLUoxetine (PROZAC) 10 MG capsule Take 1 capsule (10 mg total) by mouth daily for 15 days, THEN 2 capsules (20 mg total) daily for 15 days. 06/09/24 07/09/24 Yes Kapoor, Sahil, MD  hydrOXYzine  (VISTARIL ) 25 MG capsule Take 1 capsule (25 mg total) by mouth at bedtime as needed. 07/11/23  Yes [name], MD  ketorolac  (TORADOL ) 10 MG tablet Take 1 tablet (10 mg total) by mouth every 6 (six) hours as needed (pain). 07/06/24  Yes Vonna Sharlet POUR, MD  omeprazole  (PRILOSEC) 40 MG capsule Take 1 capsule  (40 mg total) by mouth daily. 07/07/23  Yes [name], MD  QUEtiapine (SEROQUEL) 50 MG tablet Take 1 tablet (50 mg total) by mouth at bedtime. 06/09/24  Yes Kapoor, Sahil, MD  acamprosate  (CAMPRAL ) 333 MG tablet Take 2 tablets (666 mg total) by mouth 3 (three) times daily. Patient not taking: No sig reported 07/11/23   [name], MD  albuterol  (VENTOLIN  HFA) 108 [phone] Base) MCG/ACT inhaler Inhale 2 puffs into the lungs every 6 (six) hours as needed for wheezing or shortness of breath (Cough). Patient not taking: No sig reported 07/11/23   [name], MD  cyclobenzaprine  (FLEXERIL ) 5 MG tablet Take 1 tablet (5 mg total) by mouth at bedtime as needed. 03/20/24   [name], PA-C  fluticasone  (FLONASE ) 50 MCG/ACT nasal spray Place 1 spray into both nostrils daily. Begin by using 2 sprays in each nare daily for 3 to 5 days, then decrease to 1 spray in each nare daily. 06/13/23   [name], MD  Olopatadine  HCl (PATADAY ) 0.2 % SOLN Apply 1 drop to eye daily. 06/13/23   [name], MD  sildenafil  (VIAGRA ) 50 MG tablet Take 1-2 tablets (50-100 mg total) by mouth daily as needed for erectile dysfunction. 06/13/23   [name], MD  tiZANidine  (ZANAFLEX ) 4 MG tablet Take 1 tablet (4 mg total)  by mouth at bedtime. 10/06/22   [name], PA-C  traZODone  (DESYREL ) 100 MG tablet Take 1 tablet (100 mg total) by mouth at bedtime as needed. 06/09/24   Kapoor, Sahil, MD    Family History Family History  Problem Relation Age of Onset   Asthma Mother    Depression Mother    Stroke Mother    GER disease Mother    Hiatal hernia Mother    Alcohol abuse Father    Drug abuse Father    Anuerysm Father        brain   Asthma Sister    Anxiety disorder Sister    Heart failure Maternal Uncle        x 2 uncles   Diabetes Maternal Grandmother    Diabetes Paternal Grandmother    Lung cancer Paternal Grandfather    Colon cancer Neg Hx    Rectal cancer Neg Hx    Stomach cancer Neg Hx     Esophageal cancer Neg Hx     Social History Social History   Tobacco Use   Smoking status: Former    Current packs/day: 0.00    Average packs/day: 0.5 packs/day for 2.0 years (1.0 ttl pk-yrs)    Types: Cigarettes    Start date: 2019    Quit date: 2021    Years since quitting: 4.8   Smokeless tobacco: Never  Vaping Use   Vaping status: Some Days  Substance Use Topics   Alcohol use: Yes    Alcohol/week: 28.0 standard drinks of alcohol    Types: 28 Cans of beer per week    Comment: 2- 3 beers/day   Drug use: No     Allergies   Patient has no known allergies.   Review of Systems Review of Systems   Physical Exam Triage Vital Signs ED Triage Vitals  Encounter Vitals Group     BP 07/06/24 1344 (!) 136/91     Girls Systolic BP Percentile --      Girls Diastolic BP Percentile --      Boys Systolic BP Percentile --      Boys Diastolic BP Percentile --      Pulse Rate 07/06/24 1344 75     Resp 07/06/24 1344 16     Temp 07/06/24 1344 98 F (36.7 C)     Temp Source 07/06/24 1344 Oral     SpO2 07/06/24 1344 97 %     Weight --      Height --      Head Circumference --      Peak Flow --      Pain Score 07/06/24 1342 7     Pain Loc --      Pain Education --      Exclude from Growth Chart --    No data found.  Updated Vital Signs BP (!) 136/91 (BP Location: Left Arm)   Pulse 75   Temp 98 F (36.7 C) (Oral)   Resp 16   SpO2 97%   Visual Acuity Right Eye Distance:   Left Eye Distance:   Bilateral Distance:    Right Eye Near:   Left Eye Near:    Bilateral Near:     Physical Exam Vitals reviewed.  Constitutional:      General: He is not in acute distress.    Appearance: He is not ill-appearing, toxic-appearing or diaphoretic.  HENT:     Mouth/Throat:     Mouth: Mucous membranes are moist.  Comments: There is a broken carious tooth in the posterior left upper dental ridge.  No swelling of the tissues around it. Eyes:     Extraocular Movements:  Extraocular movements intact.     Conjunctiva/sclera: Conjunctivae normal.     Pupils: Pupils are equal, round, and reactive to light.  Cardiovascular:     Rate and Rhythm: Normal rate and regular rhythm.     Heart sounds: No murmur heard. Pulmonary:     Effort: Pulmonary effort is normal.     Breath sounds: Normal breath sounds.  Musculoskeletal:     Cervical back: Neck supple.  Lymphadenopathy:     Cervical: No cervical adenopathy.  Skin:    Coloration: Skin is not pale.  Neurological:     General: No focal deficit present.     Mental Status: He is alert and oriented to person, place, and time.  Psychiatric:        Behavior: Behavior normal.      UC Treatments / Results  Labs (all labs ordered are listed, but only abnormal results are displayed) Labs Reviewed - No data to display  EKG   Radiology No results found.  Procedures Procedures (including critical care time)  Medications Ordered in UC Medications  ketorolac  (TORADOL ) 30 MG/ML injection 30 mg (has no administration in time range)    Initial Impression / Assessment and Plan / UC Course  I have reviewed the triage vital signs and the nursing notes.  Pertinent labs & imaging results that were available during my care of the patient were reviewed by me and considered in my medical decision making (see chart for details).      Final Clinical Impressions(s) / UC Diagnoses   Final diagnoses:  Dental infection     Discharge Instructions      You have been given a shot of Toradol  30 mg today.  Take amoxicillin -clavulanate 875 mg--1 tab twice daily with food for 7 days  Ketorolac  10 mg tablets--take 1 tablet every 6 hours as needed for pain.  This is the same medicine that is in the shot we just gave you  I am glad you have follow-up with your dentist.     ED Prescriptions     Medication Sig Dispense Auth. Provider   amoxicillin -clavulanate (AUGMENTIN) 875-125 MG tablet Take 1 tablet by mouth  2 (two) times daily for 7 days. 14 tablet Larenda Reedy K, MD   ketorolac  (TORADOL ) 10 MG tablet Take 1 tablet (10 mg total) by mouth every 6 (six) hours as needed (pain). 20 tablet Demarrion Meiklejohn, Sharlet POUR, MD      I have reviewed the PDMP during this encounter.   Vonna Sharlet POUR, MD 07/06/24 [phone]

## 2024-07-06 NOTE — Discharge Instructions (Addendum)
 You have been given a shot of Toradol  30 mg today.  Take amoxicillin -clavulanate 875 mg--1 tab twice daily with food for 7 days  Ketorolac  10 mg tablets--take 1 tablet every 6 hours as needed for pain.  This is the same medicine that is in the shot we just gave you  I am glad you have follow-up with your dentist.

## 2024-07-06 NOTE — ED Triage Notes (Signed)
 Pt states he has left upper dental pain X 3 days he has been taking IBU as needed.

## 2024-07-07 ENCOUNTER — Ambulatory Visit (HOSPITAL_COMMUNITY)

## 2024-07-07 VITALS — BP 137/87 | HR 89 | Ht 66.0 in | Wt 150.0 lb

## 2024-07-07 DIAGNOSIS — F1010 Alcohol abuse, uncomplicated: Secondary | ICD-10-CM

## 2024-07-07 DIAGNOSIS — F411 Generalized anxiety disorder: Secondary | ICD-10-CM | POA: Diagnosis not present

## 2024-07-07 DIAGNOSIS — F321 Major depressive disorder, single episode, moderate: Secondary | ICD-10-CM | POA: Insufficient documentation

## 2024-07-07 DIAGNOSIS — F5104 Psychophysiologic insomnia: Secondary | ICD-10-CM

## 2024-07-07 MED ORDER — NALTREXONE HCL 50 MG PO TABS: 50.0000 mg | ORAL_TABLET | Freq: Every day | ORAL | 0 refills | Status: DC

## 2024-07-07 NOTE — Addendum Note (Signed)
 Addended by: Elston Aldape on: 07/07/2024 04:50 PM   Modules accepted: Orders

## 2024-07-21 NOTE — Progress Notes (Signed)
 [facility] Return Visit   Referring Provider Leavy Rode, [place], PA-C [address] [place],  [place] [address]  Primary Care Provider Leavy Rode, [place], [place]  Patient Profile: Juan Esparza is a 43 y.o. male who returns to the [facility] for follow-up of the problem(s) noted below.  Problem List: GERD H. pylori negative gastritis Acute on chronic diarrhea Colonic diverticulosis   History of Present Illness    Discussed the use of AI scribe software for clinical note transcription with the patient, who gave verbal consent to proceed.  History of Present Illness Juan Esparza is a 43 year old gentleman with a past medical history noteworthy for anxiety, depression, alcohol use disorder, possible sleep apnea who returns to the gastroenterology office for follow-up of GERD, nausea, vomiting and acute on chronic diarrhea  Current GI Meds  Omeprazole  40 mg p.o. daily  Interval History   Previous GI evaluation - Seen 10/16/2023 for nausea, vomiting and chronic diarrhea - EGD/colonoscopy 03/2024 -mild H. pylori negative gastritis, normal colon and TI without microscopic colitis - Reports that symptoms improved after endoscopic procedures  - Continued on omeprazole  40 mg p.o. daily for management of GERD - Suspected that chronic diarrhea could be related to chronic alcohol use  Acute diarrhea and abdominal discomfort - Significant diarrhea onset yesterday, occurring throughout the day and night - Diarrhea disrupts sleep - Mild abdominal pain present - No vomiting, blood, or mucus in stools - Feels drained and sore due to frequent bowel movements - Attempts to maintain hydration by drinking water  - Completed a 2-week course course of amoxicillin  for tooth pain -discussed possibility of C. Difficile - Denies ill contacts or eating out at restaurants/fast food  Alcohol consumption and symptom triggers - History of similar episodes  of diarrhea, often triggered by alcohol, particularly beer - No longer drinks beer - Consumed nearly a whole bottle of champagne on Monday and Tuesday prior to symptom onset - Seeking counseling support for management of alcohol use disorder - LFTs and liver normal on imaging 10/2023  Gastroesophageal reflux disease - Continues omeprazole  40 mg orally daily for reflux - Feels reflux is well-controlled denying GERD, heartburn, pyrosis, waterbrash or dysphagia - Requires medication refill  GI Review of Symptoms Significant for diarrhea. Otherwise negative.  General Review of Systems  Review of systems is significant for the pertinent positives and negatives as listed per the HPI.  Full ROS is otherwise negative.  Past Medical History   Past Medical History:  Diagnosis Date   Alcoholism (HCC)    Anxiety    Depression    GERD (gastroesophageal reflux disease)    Sleep apnea      Past Surgical History   Past Surgical History:  Procedure Laterality Date   NO PAST SURGERIES       Allergies and Medications   No Known Allergies  Current Meds  Medication Sig   acamprosate  (CAMPRAL ) 333 MG tablet Take 2 tablets (666 mg total) by mouth 3 (three) times daily.   albuterol  (VENTOLIN  HFA) 108 (90 Base) MCG/ACT inhaler Inhale 2 puffs into the lungs every 6 (six) hours as needed for wheezing or shortness of breath (Cough).   cyclobenzaprine  (FLEXERIL ) 5 MG tablet Take 1 tablet (5 mg total) by mouth at bedtime as needed.   FLUoxetine  (PROZAC ) 10 MG capsule Take 1 capsule (10 mg total) by mouth daily for 15 days, THEN 2 capsules (20 mg total) daily for 15 days.   fluticasone  (FLONASE ) 50  MCG/ACT nasal spray Place 1 spray into both nostrils daily. Begin by using 2 sprays in each nare daily for 3 to 5 days, then decrease to 1 spray in each nare daily.   hydrOXYzine  (VISTARIL ) 25 MG capsule Take 1 capsule (25 mg total) by mouth at bedtime as needed.   ketorolac  (TORADOL ) 10 MG tablet Take 1  tablet (10 mg total) by mouth every 6 (six) hours as needed (pain).   naltrexone  (DEPADE) 50 MG tablet Take 1 tablet (50 mg total) by mouth daily.   Olopatadine  HCl (PATADAY ) 0.2 % SOLN Apply 1 drop to eye daily.   QUEtiapine  (SEROQUEL ) 50 MG tablet Take 1 tablet (50 mg total) by mouth at bedtime.   sildenafil  (VIAGRA ) 50 MG tablet Take 1-2 tablets (50-100 mg total) by mouth daily as needed for erectile dysfunction.   tiZANidine  (ZANAFLEX ) 4 MG tablet Take 1 tablet (4 mg total) by mouth at bedtime.   traZODone  (DESYREL ) 100 MG tablet Take 1 tablet (100 mg total) by mouth at bedtime as needed.   [DISCONTINUED] omeprazole  (PRILOSEC) 40 MG capsule Take 1 capsule (40 mg total) by mouth daily.     Family His   Family History  Problem Relation Age of Onset   Asthma Mother    Depression Mother    Stroke Mother    GER disease Mother    Hiatal hernia Mother    Alcohol abuse Father    Drug abuse Father    Anuerysm Father        brain   Asthma Sister    Anxiety disorder Sister    Heart failure Maternal Uncle        x 2 uncles   Diabetes Maternal Grandmother    Diabetes Paternal Grandmother    Lung cancer Paternal Grandfather    Colon cancer Neg Hx    Rectal cancer Neg Hx    Stomach cancer Neg Hx    Esophageal cancer Neg Hx     Social History   Social History   Tobacco Use   Smoking status: Former    Current packs/day: 0.00    Average packs/day: 0.5 packs/day for 2.0 years (1.0 ttl pk-yrs)    Types: Cigarettes    Start date: 2019    Quit date: 2021    Years since quitting: 4.8   Smokeless tobacco: Never  Vaping Use   Vaping status: Some Days  Substance Use Topics   Alcohol use: Yes    Alcohol/week: 28.0 standard drinks of alcohol    Types: 28 Cans of beer per week    Comment: 2- 3 beers/day   Drug use: No   Juan Esparza reports that he quit smoking about 4 years ago. His smoking use included cigarettes. He started smoking about 6 years ago. He has a 1 pack-year smoking  history. He has never used smokeless tobacco. He reports current alcohol use of about 28.0 standard drinks of alcohol per week. He reports that he does not use drugs.  Vital Signs and Physical Examination   Vitals:   07/22/24 0950  BP: 132/80  Pulse: 96   Body mass index is 25.18 kg/m. Weight: 156 lb (70.8 kg)  General: Well developed, well nourished, no acute distress Head: Normocephalic and atraumatic Eyes: Sclerae anicteric, EOMI Lungs: Clear throughout to auscultation Heart: Regular rate and rhythm; No murmurs, rubs or bruits Abdomen: Soft, non tender and non distended. No masses, hepatosplenomegaly or hernias noted. Normal Bowel sounds Rectal: Deferred Musculoskeletal: Symmetrical with no gross deformities  Review of Data   The following data was reviewed at the time of this encounter:   Laboratory Studies      Latest Ref Rng & Units 10/27/2023    7:55 AM 10/26/2023    1:49 PM 10/16/2023    2:32 PM  CBC  WBC 4.0 - 10.5 K/uL 6.9  11.8  7.5   Hemoglobin 13.0 - 17.0 g/dL 86.4  84.9  87.6   Hematocrit 39.0 - 52.0 % 41.7  46.6  37.8   Platelets 150 - 400 K/uL 237  286  282.0     Lab Results  Component Value Date   LIPASE 30 10/27/2023      Latest Ref Rng & Units 10/27/2023    7:55 AM 10/26/2023    1:49 PM 10/16/2023    2:32 PM  CMP  Glucose 70 - 99 mg/dL 862  842  885   BUN 6 - 20 mg/dL 12  17  13    Creatinine 0.61 - 1.24 mg/dL 8.57  9.03  9.02   Sodium 135 - 145 mmol/L 134  136  139   Potassium 3.5 - 5.1 mmol/L 3.7  3.6  3.6   Chloride 98 - 111 mmol/L 101  99  104   CO2 22 - 32 mmol/L 23  22  25    Calcium  8.9 - 10.3 mg/dL 9.3  89.8  8.9   Total Protein 6.5 - 8.1 g/dL 7.6  9.4  7.3   Total Bilirubin 0.0 - 1.2 mg/dL 0.8  1.4  0.8   Alkaline Phos 38 - 126 U/L 46  65  54   AST 15 - 41 U/L 27  27  19    ALT 0 - 44 U/L 26  31  20     Lab Results  Component Value Date   TSH 0.97 10/16/2023    Imaging Studies  CTAP 10/26/2023 1. Gas fluid levels throughout  the colon consistent with history of diarrhea. No bowel obstruction or ileus. 2. Distal colonic diverticulosis without diverticulitis.  RUQ ultrasound 06/24/2023 No cholelithiasis or sonographic evidence for acute cholecystitis.   GI Procedures and Studies  EGD 03/11/2024 Scattered mild inflammation in the gastric body and antrum Diminutive gastric polyps Normal esophagus and duodenum  Path: Reactive gastropathy with minimal chronic gastritis H. pylori negative; normal duodenal biopsies  Colonoscopy 03/11/2024 Normal mucosa in the colon and terminal ileum Pandiverticulosis  Path: Colon biopsies negative for microscopic colitis  Clinical Impression  It is my clinical impression that Juan Esparza is a 43 y.o. male with;  GERD H. pylori negative gastritis Acute on chronic diarrhea Colonic diverticulosis  Juan Esparza was initially evaluated in the office 10/2023 for nausea, vomiting and chronic diarrhea.  EGD/colonoscopy 03/2024 disclosed H. pylori negative gastritis; colonoscopy was normal without polyps or microscopic colitis.  He reports that his symptoms had largely improved by the time of his endoscopic evaluation.  He had continued on omeprazole  40 mg orally daily for management of his upper GI symptoms that adequately controlled his GERD.  His chronic diarrhea was felt to be a sequelae of chronic alcohol use and he is making efforts at abstinence.  He presents to the office today reporting a 24-hour history of acute on chronic diarrhea.  States that yesterday he abruptly developed the onset of profuse, watery loose stools.  No blood or mucus.  He did recently complete a 2-week course of amoxicillin  for dental issues.  Denies eating out at restaurants or ill contacts.  Discussed that the abrupt change  in his symptoms seems most consistent with an acute infectious etiology.  Since he has been on antibiotics ruling out C. difficile in addition to other pathogens would be reasonable.  The profuse  nature of his diarrhea sounds inconsistent with typical antibiotic associated diarrhea.  He relates that he previously had diarrhea when consuming beer.  He is no longer drinking beer but did drink bottles of champagne earlier this week and diarrhea as a sequelae of alcohol consumption is also a possibility.  GERD is currently well-controlled on omeprazole  and he requests a refill today.  Colonoscopy 03/2024 did not disclose any colon polyps-he will be due for next screening colonoscopy 03/2034.  Plan  Stool studies requested today-stool culture, C. difficile, ova and parasite BRAT diet and consideration of probiotics If infections are ruled out can consider judicious use of Imodium  Advised alcohol sensation and counseled regarding laxative effects of alcohol Continue omeprazole  40 mg orally daily GERD diet and lifestyle modification Next screening colonoscopy due 03/2034  Planned Follow Up 3-4 months  The patient or caregiver verbalized understanding of the material covered, with no barriers to understanding. All questions were answered. Patient or caregiver is agreeable with the plan outlined above.    It was a pleasure to see Juan Esparza.  If you have any questions or concerns regarding this evaluation, do not hesitate to contact me.  [name] Hausen, MD [facility]   I spent total of 30 minutes in both face-to-face (20 minutes interview) and non-face-to-face (10 minutes chart review, care coordination, documentation)  activities, excluding procedures performed, for the visit on the date of this encounter.
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
## 2024-07-22 ENCOUNTER — Encounter: Payer: Self-pay | Admitting: Pediatrics

## 2024-07-22 ENCOUNTER — Other Ambulatory Visit: Payer: Self-pay

## 2024-07-22 ENCOUNTER — Ambulatory Visit: Admitting: Pediatrics

## 2024-07-22 ENCOUNTER — Other Ambulatory Visit

## 2024-07-22 VITALS — BP 132/80 | HR 96 | Ht 66.0 in | Wt 156.0 lb

## 2024-07-22 DIAGNOSIS — K219 Gastro-esophageal reflux disease without esophagitis: Secondary | ICD-10-CM

## 2024-07-22 DIAGNOSIS — R197 Diarrhea, unspecified: Secondary | ICD-10-CM | POA: Diagnosis not present

## 2024-07-22 MED ORDER — OMEPRAZOLE 40 MG PO CPDR: 40.0000 mg | DELAYED_RELEASE_CAPSULE | Freq: Every day | ORAL | 3 refills | Status: AC

## 2024-07-22 NOTE — Patient Instructions (Addendum)
 Your provider has requested that you go to the basement level for lab work before leaving today. Press B on the elevator. The lab is located at the first door on the left as you exit the elevator.  Due to recent changes in healthcare laws, you may see the results of your imaging and laboratory studies on MyChart before your provider has had a chance to review them.  We understand that in some cases there may be results that are confusing or concerning to you. Not all laboratory results come back in the same time frame and the provider may be waiting for multiple results in order to interpret others.  Please give us  43 hours in order for your provider to thoroughly review all the results before contacting the office for clarification of your results.   Refills for Omeprazole  40 mg were sent to your pharmacy.  Follow up in 3-4 months or sooner if needed.  Thank you for entrusting me with your care and for choosing [facility], Dr. [name] Hausen  _______________________________________________________  If your blood pressure at your visit was 140/90 or greater, please contact your primary care physician to follow up on this.  _______________________________________________________  If you are age 43 or older, your body mass index should be between 23-30. Your Body mass index is 25.18 kg/m. If this is out of the aforementioned range listed, please consider follow up with your Primary Care Provider.  If you are age 43 or younger, your body mass index should be between 19-25. Your Body mass index is 25.18 kg/m. If this is out of the aforementioned range listed, please consider follow up with your Primary Care Provider.   ________________________________________________________  The [facility] providers would like to encourage you to use MYCHART to communicate with providers for non-urgent requests or questions.  Due to long hold times on the telephone, sending your provider a message by  [facility] may be a faster and more efficient way to get a response.  Please allow 48 business hours for a response.  Please remember that this is for non-urgent requests.  _______________________________________________________  [facility] is using a team-based approach to care.  Your team is made up of your doctor and two to three APPS. Our APPS (Nurse Practitioners and Physician Assistants) work with your physician to ensure care continuity for you. They are fully qualified to address your health concerns and develop a treatment plan. They communicate directly with your gastroenterologist to care for you. Seeing the Advanced Practice Practitioners on your physician's team can help you by facilitating care more promptly, often allowing for earlier appointments, access to diagnostic testing, procedures, and other specialty referrals.

## 2024-07-24 ENCOUNTER — Encounter: Payer: Self-pay | Admitting: Pediatrics

## 2024-07-26 ENCOUNTER — Other Ambulatory Visit: Payer: Self-pay

## 2024-07-26 DIAGNOSIS — N529 Male erectile dysfunction, unspecified: Secondary | ICD-10-CM

## 2024-07-26 MED ORDER — SILDENAFIL CITRATE 50 MG PO TABS: 50.0000 mg | ORAL_TABLET | Freq: Every day | ORAL | 3 refills | Status: AC | PRN

## 2024-07-26 NOTE — Telephone Encounter (Signed)
 Refill sent to pharmacy.

## 2024-07-26 NOTE — Telephone Encounter (Unsigned)
 Copied from CRM #[id number]. Topic: Clinical - Medication Refill >> Jul 26, 2024 10:15 AM Zebedee SAUNDERS wrote: Medication: sildenafil  (VIAGRA ) 50 MG tablet  Has the patient contacted their pharmacy? Yes (Agent: If no, request that the patient contact the pharmacy for the refill. If patient does not wish to contact the pharmacy document the reason why and proceed with request.) (Agent: If yes, when and what did the pharmacy advise?)Pharmacy need script  This is the patient's preferred pharmacy:  [id number] [place], [place] - [address] [place] [address] Phone: [phone] Fax: [phone]  Is this the correct pharmacy for this prescription? Yes If no, delete pharmacy and type the correct one.   Has the prescription been filled recently? Yes  Is the patient out of the medication? Yes  Has the patient been seen for an appointment in the last year OR does the patient have an upcoming appointment? Yes  Can we respond through MyChart? Yes  Agent: Please be advised that Rx refills may take up to 3 business days. We ask that you follow-up with your pharmacy.

## 2024-07-27 NOTE — Telephone Encounter (Signed)
 Duplicate request, refilled 07/26/24.  Requested Prescriptions  Pending Prescriptions Disp Refills   sildenafil  (VIAGRA ) 50 MG tablet 30 tablet 5    Sig: Take 1-2 tablets (50-100 mg total) by mouth daily as needed for erectile dysfunction.     Urology: Erectile Dysfunction Agents Passed - 07/27/2024 12:10 PM      Passed - AST in normal range and within 360 days    AST  Date Value Ref Range Status  10/27/2023 27 15 - 41 U/L Final         Passed - ALT in normal range and within 360 days    ALT  Date Value Ref Range Status  10/27/2023 26 0 - 44 U/L Final         Passed - Last BP in normal range    BP Readings from Last 1 Encounters:  07/22/24 132/80         Passed - Valid encounter within last 12 months    Recent Outpatient Visits           2 months ago Bilateral low back pain without sciatica, unspecified chronicity   [facility] Lucas Fox, PA-C   3 months ago Encounter to establish care with new provider   [facility] at [facility] Lucas Fox, PA-C

## 2024-08-04 ENCOUNTER — Ambulatory Visit: Admitting: Pediatrics

## 2024-08-04 ENCOUNTER — Ambulatory Visit (HOSPITAL_COMMUNITY)

## 2024-08-04 VITALS — BP 150/102 | HR 76 | Ht 66.0 in | Wt 156.0 lb

## 2024-08-04 DIAGNOSIS — F411 Generalized anxiety disorder: Secondary | ICD-10-CM | POA: Diagnosis not present

## 2024-08-04 DIAGNOSIS — F321 Major depressive disorder, single episode, moderate: Secondary | ICD-10-CM

## 2024-08-04 DIAGNOSIS — F1010 Alcohol abuse, uncomplicated: Secondary | ICD-10-CM | POA: Diagnosis not present

## 2024-08-04 DIAGNOSIS — F5104 Psychophysiologic insomnia: Secondary | ICD-10-CM | POA: Diagnosis not present

## 2024-08-06 ENCOUNTER — Ambulatory Visit

## 2024-08-06 ENCOUNTER — Ambulatory Visit (HOSPITAL_COMMUNITY)

## 2024-08-09 ENCOUNTER — Ambulatory Visit (HOSPITAL_COMMUNITY)

## 2024-08-11 ENCOUNTER — Ambulatory Visit (HOSPITAL_COMMUNITY)

## 2024-08-13 ENCOUNTER — Ambulatory Visit (HOSPITAL_COMMUNITY)

## 2024-08-16 ENCOUNTER — Ambulatory Visit (HOSPITAL_COMMUNITY)

## 2024-08-24 ENCOUNTER — Ambulatory Visit (INDEPENDENT_AMBULATORY_CARE_PROVIDER_SITE_OTHER): Admitting: Licensed Clinical Social Worker

## 2024-08-24 DIAGNOSIS — F1010 Alcohol abuse, uncomplicated: Secondary | ICD-10-CM

## 2024-08-24 DIAGNOSIS — F321 Major depressive disorder, single episode, moderate: Secondary | ICD-10-CM

## 2024-08-24 DIAGNOSIS — F411 Generalized anxiety disorder: Secondary | ICD-10-CM
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
## 2024-08-25 ENCOUNTER — Encounter (HOSPITAL_COMMUNITY): Payer: Self-pay

## 2024-08-30 ENCOUNTER — Other Ambulatory Visit: Payer: Self-pay | Admitting: Family Medicine

## 2024-08-30 ENCOUNTER — Telehealth: Admitting: Physician Assistant

## 2024-08-30 DIAGNOSIS — R051 Acute cough: Secondary | ICD-10-CM

## 2024-08-30 DIAGNOSIS — Z20828 Contact with and (suspected) exposure to other viral communicable diseases: Secondary | ICD-10-CM

## 2024-08-30 DIAGNOSIS — R6889 Other general symptoms and signs: Secondary | ICD-10-CM | POA: Diagnosis not present

## 2024-08-30 DIAGNOSIS — M5136 Other intervertebral disc degeneration, lumbar region with discogenic back pain only: Secondary | ICD-10-CM

## 2024-08-30 DIAGNOSIS — M791 Myalgia, unspecified site: Secondary | ICD-10-CM

## 2024-08-30 MED ORDER — OSELTAMIVIR PHOSPHATE 75 MG PO CAPS: 75.0000 mg | ORAL_CAPSULE | Freq: Two times a day (BID) | ORAL | 0 refills | Status: AC

## 2024-08-30 NOTE — Progress Notes (Signed)
 E visit for Flu like symptoms   We are sorry that you are not feeling well.  Here is how we plan to help! Based on what you have shared with me it looks like you may have a respiratory virus that may be influenza.  Influenza or the flu is  an infection caused by a respiratory virus. The flu virus is highly contagious and persons who did not receive their yearly flu vaccination may catch the flu from close contact.  We have anti-viral medications to treat the viruses that cause this infection. They are not a cure and only shorten the course of the infection. These prescriptions are most effective when they are given within the first 2 days of flu symptoms. Antiviral medications are indicated if you have a high risk of complications from the flu. You should  also consider an antiviral medication if you are in close contact with someone who is at risk. These medications can help patients avoid complications from the flu but have side effects that you should know.   Possible side effects from Tamiflu  or oseltamivir  include nausea, vomiting, diarrhea, dizziness, headaches, eye redness, sleep problems or other respiratory symptoms. You should not take Tamiflu  if you have an allergy to oseltamivir  or any to the ingredients in Tamiflu .  Based upon your symptoms and potential risk factors I have prescribed Oseltamivir  (Tamiflu ).  It has been sent to your designated pharmacy.  You will take one 75 mg capsule orally twice a day for the next 5 days.   For nasal congestion, you may use an oral decongestant such as Mucinex  D or if you have glaucoma or high blood pressure use plain Mucinex .  Saline nasal spray or nasal drops can help and can safely be used as often as needed for congestion.  If you have a sore or scratchy throat, use a saltwater gargle-  to  teaspoon of salt dissolved in a 4-ounce to 8-ounce glass of warm water.  Gargle the solution for approximately 15-30 seconds and then spit.  It is  important not to swallow the solution.  You can also use throat lozenges/cough drops and Chloraseptic spray to help with throat pain or discomfort.  Warm or cold liquids can also be helpful in relieving throat pain.  For headache, pain or general discomfort, you can use Ibuprofen or Tylenol  as directed.   Some authorities believe that zinc sprays or the use of Echinacea may shorten the course of your symptoms.  You are to isolate at home until you have been fever-free for at least 24 hours without a fever-reducing medication, and symptoms have been steadily improving for 24 hours.  If you must be around other household members who do not have symptoms, you need to make sure that both you and the family members are masking consistently with a high-quality mask.  If you note any worsening of symptoms despite treatment, please seek an in-person evaluation ASAP. If you note any significant shortness of breath or any chest pain, please seek ED evaluation. Please do not delay care!  ANYONE WHO HAS FLU SYMPTOMS SHOULD: Stay home. The flu is highly contagious and going out or to work exposes others! Be sure to drink plenty of fluids. Water is fine as well as fruit juices, sodas and electrolyte beverages. You may want to stay away from caffeine or alcohol. If you are nauseated, try taking small sips of liquids. How do you know if you are getting enough fluid? Your urine should be a  pale yellow or almost colorless. Get rest. Taking a steamy shower or using a humidifier may help nasal congestion and ease sore throat pain. Using a saline nasal spray works much the same way. Cough drops, hard candies and sore throat lozenges may ease your cough. Line up a caregiver. Have someone check on you regularly.  GET HELP RIGHT AWAY IF: You cannot keep down liquids or your medications. You become short of breath Your fell like you are going to pass out or loose consciousness. Your symptoms persist after you have  completed your treatment plan  MAKE SURE YOU  Understand these instructions. Will watch your condition. Will get help right away if you are not doing well or get worse.  Your e-visit answers were reviewed by a board certified advanced clinical practitioner to complete your personal care plan.  Depending on the condition, your plan could have included both over the counter or prescription medications.  If there is a problem please reply  once you have received a response from your provider.  Your safety is important to us .  If you have drug allergies check your prescription carefully.    You can use MyChart to ask questions about todays visit, request a non-urgent call back, or ask for a work or school excuse for 24 hours related to this e-Visit. If it has been greater than 24 hours you will need to follow up with your provider, or enter a new e-Visit to address those concerns.  You will get an e-mail in the next two days asking about your experience.  I hope that your e-visit has been valuable and will speed your recovery. Thank you for using e-visits.   I have spent 5 minutes in review of e-visit questionnaire, review and updating patient chart, medical decision making and response to patient.   Delon CHRISTELLA Dickinson, PA-C

## 2024-08-31 ENCOUNTER — Ambulatory Visit: Payer: Self-pay

## 2024-08-31 NOTE — Telephone Encounter (Signed)
 FYI Only or Action Required?: Action required by provider: clinical question for provider and update on patient condition.  Patient was last seen in primary care on 05/20/2024 by Leavy Rode, S[place], PA-C.  Called Nurse Triage reporting Headache and Hypertension.  Symptoms began yesterday.  Interventions attempted: OTC medications: tylenol  and Prescription medications: tamiflu .  Symptoms are: unchanged.  Triage Disposition: See PCP Within 2 Weeks  Patient/caregiver understands and will follow disposition?:   Message from [facility] G sent at 08/31/2024 10:32 AM EST  Reason for Triage: ( bp issues ) BP 144/114, headache, been sick w/flu.. ( fever 99 )     Reason for Disposition  [1] Systolic BP >= 130 OR Diastolic >= 80 AND [2] not taking BP medications  Answer Assessment - Initial Assessment Questions Returned pt's call to f/u on symptoms. Pt states that his fever has gone down from 102 to 99.68F. Pt was prescribed tamiflu  to aid with symptom relief; pt reports he is feeling somewhat better at this time. Pt states that he has had a h/a x 2days; checked BP today and it was elevated, 144/114. Pt denies any hx of HTN or taking HTN medication. PT denies any chest pain, SOB. Pt states he does have a h/a but it comes and goes. Relieved with tylenol . Pt is able to monitor BP at home; denies any symptoms. Discussed appt with PCP 09/07/24. Reassured him I would f/u with provider and office will advise him further. Pt voiced appreciation. Discussed red flag symptoms of dizziness, extreme h/a, changes in vision, chest pain, SOB that would need immediate attention at ED.     1. BLOOD PRESSURE: What is your blood pressure? Did you take at least two measurements 5 minutes apart?     114/114 R arm; 164/111 R arm   2. ONSET: When did you take your blood pressure?     Prior to contacting NT; 1030am and while on the phone with NT 205pm   3. HOW: How did you take your blood pressure? (e.g.,  automatic home BP monitor, visiting nurse)     Automatic BP monitor; R arm   4. HISTORY: Do you have a history of high blood pressure?     No   5. MEDICINES: Are you taking any medicines for blood pressure? Have you missed any doses recently?     None   6. OTHER SYMPTOMS: Do you have any symptoms? (e.g., blurred vision, chest pain, difficulty breathing, headache, weakness)     H/a; comes and goes  Protocols used: Blood Pressure - High-A-AH

## 2024-08-31 NOTE — Telephone Encounter (Signed)
 Spoke with patient. Patient stated he does not have a history of hypertension but has been experiencing elevated blood pressure readings along with headaches since being diagnosed with the FLU. Advised patient to drink plenty of fluids and avoid sugary drinks. Informed patient that symptoms should improve; however, if they do not, he should call us  back or consider going to urgent care for further evaluation. Patient verbalized understanding and agreement with the plan.

## 2024-09-01 NOTE — Telephone Encounter (Signed)
 noted

## 2024-09-07 ENCOUNTER — Ambulatory Visit

## 2024-09-07 VITALS — BP 134/92 | HR 69 | Temp 98.1°F | Resp 16 | Ht 66.0 in | Wt 156.0 lb

## 2024-09-07 DIAGNOSIS — I10 Essential (primary) hypertension: Secondary | ICD-10-CM

## 2024-09-07 MED ORDER — HYDROCHLOROTHIAZIDE 25 MG PO TABS: 25.0000 mg | ORAL_TABLET | Freq: Every day | ORAL | 3 refills | Status: AC

## 2024-09-07 NOTE — Progress Notes (Unsigned)
" ° ° ° °  Patient ID: Juan Esparza, male    DOB: August 29, 1981  MRN: [id number]  CC: Medical Management of Chronic Issues   Subjective: Juan Esparza is a 44 y.o. male with past medical history of depression who presents to clinic with chief complaint of elevated blood pressure readings. Pt reports that he has dealt with life stressors recently including death of his father. Continues to be out of work on workers compensation.    Allergies[1]  ROS: Review of Systems Negative except as stated above  PHYSICAL EXAM: BP (!) 134/92   Pulse 69   Temp 98.1 F (36.7 C) (Oral)   Resp 16   Ht 5' 6 (1.676 m)   Wt 156 lb (70.8 kg)   SpO2 98%   BMI 25.18 kg/m   Physical Exam  General: well-appearing, no acute distress Skin: no jaundice, rashes, or lesions Cardiovascular: regular heart rate and rhythm, normal S1/S2, no murmurs, gallops, or rubs, peripheral pulses 2+ bilaterally Chest: no skeletal deformity, lungs clear to auscultation bilaterally, equal breath sounds bilaterally Musculoskeletal: normal gait Extremities: no peripheral edema  ASSESSMENT AND PLAN:  1. Primary hypertension (Primary) Given repeated elevated blood pressure readings, warrants diagnosis of primary hypertension. BP today 134/92. Discussed with patient various medication options and recommended lifestyle changes which include a low sodium diet. Education handout provided. Pt would like to proceed with hydrochlorothiazide .  - Discussed method of action of medication and potential side effects.  - Start hydrochlorothiazide  (HYDRODIURIL ) 25 MG tablet; Take 1 tablet (25 mg total) by mouth daily.  Dispense: 90 tablet; Refill: 3   Patient was given the opportunity to ask questions.  Patient verbalized understanding of the plan and was able to repeat key elements of the plan.    No orders of the defined types were placed in this encounter.    Requested Prescriptions   Signed Prescriptions Disp Refills    hydrochlorothiazide  (HYDRODIURIL ) 25 MG tablet 90 tablet 3    Sig: Take 1 tablet (25 mg total) by mouth daily.    Return in about 1 month (around 10/08/2024) for follow-up blood pressure.  Sula Cower Deigo Alonso, PA-C      [1] No Known Allergies  "
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
## 2024-09-15 NOTE — Discharge Instructions (Signed)
Intra-Discal Anesthetic Injection Discharge Instruction Sheet  You may resume a regular diet and any medications that you routinely take (including pain medications).  No driving day of procedure.  Light activity throughout the rest of the day.  Do not do any strenuous work, exercise, bending or lifting.  The day following the procedure, you can resume normal physical activity but you should refrain from exercising or physical therapy for at least three days thereafter.   Please contact our office at [phone] for the following symptoms: Fever greater than 100 degrees. Headaches unresolved with medication after 2-3 days. 

## 2024-09-16 ENCOUNTER — Inpatient Hospital Stay
Admission: RE | Admit: 2024-09-16 | Discharge: 2024-09-16 | Disposition: A | Source: Ambulatory Visit | Attending: Family Medicine

## 2024-09-16 DIAGNOSIS — M5136 Other intervertebral disc degeneration, lumbar region with discogenic back pain only: Secondary | ICD-10-CM

## 2024-09-16 MED ORDER — CEFAZOLIN SODIUM-DEXTROSE 2-4 GM/100ML-% IV SOLN
2.0000 g | INTRAVENOUS | Status: AC
  Administered 2024-09-16: 2 g via INTRAVENOUS

## 2024-09-16 MED ORDER — IOPAMIDOL (ISOVUE-M 200) INJECTION 41%
1.0000 mL | Freq: Once | INTRAMUSCULAR | Status: AC
  Administered 2024-09-16: 1 mL

## 2024-09-16 MED ORDER — METHYLPREDNISOLONE ACETATE 40 MG/ML INJ SUSP (RADIOLOG
80.0000 mg | Freq: Once | INTRAMUSCULAR | Status: AC
  Administered 2024-09-16: 80 mg via INTRALESIONAL

## 2024-09-16 MED ORDER — ONDANSETRON HCL 4 MG/2ML IJ SOLN: 4.0000 mg | Freq: Once | INTRAMUSCULAR | Status: DC

## 2024-09-22 ENCOUNTER — Ambulatory Visit (HOSPITAL_COMMUNITY)

## 2024-09-23 ENCOUNTER — Ambulatory Visit (HOSPITAL_COMMUNITY)
Admission: EM | Admit: 2024-09-23 | Discharge: 2024-09-23 | Disposition: A | Discharge status: Home / Self Care | Attending: Emergency Medicine | Admitting: Emergency Medicine

## 2024-09-23 ENCOUNTER — Encounter (HOSPITAL_COMMUNITY): Payer: Self-pay

## 2024-09-23 DIAGNOSIS — K219 Gastro-esophageal reflux disease without esophagitis: Secondary | ICD-10-CM | POA: Diagnosis not present

## 2024-09-23 MED ORDER — LIDOCAINE VISCOUS HCL 2 % MT SOLN
OROMUCOSAL | Status: AC
  Filled 2024-09-23: qty 15

## 2024-09-23 MED ORDER — LIDOCAINE VISCOUS HCL 2 % MT SOLN
15.0000 mL | Freq: Once | OROMUCOSAL | Status: AC
  Administered 2024-09-23: 15 mL via OROMUCOSAL

## 2024-09-23 MED ORDER — ALUM & MAG HYDROXIDE-SIMETH 200-200-20 MG/5ML PO SUSP
30.0000 mL | Freq: Once | ORAL | Status: AC
  Administered 2024-09-23: 30 mL via ORAL

## 2024-09-23 MED ORDER — ALUM & MAG HYDROXIDE-SIMETH 200-200-20 MG/5ML PO SUSP
ORAL | Status: AC
  Filled 2024-09-23: qty 30

## 2024-09-23 NOTE — Discharge Instructions (Addendum)
 Take the omeprazole  twice daily for the next 30 days Avoid fried foods, spicy foods, alcohol and acidic foods such as tomatoes or citrus Follow-up with your primary care provider within the next 30 days for reevaluation  Seek immediate care at the nearest emergency department if you develop any new concerning symptoms

## 2024-09-23 NOTE — ED Triage Notes (Signed)
 PT states he feels a burning sensation in his chest after he eats. Patient states he does have a history of reflux.

## 2024-09-23 NOTE — ED Provider Notes (Signed)
 " [facility]    CSN: [id number] Arrival date & time: 09/23/24  1319      History   Chief Complaint Chief Complaint  Patient presents with   Cough   Gastroesophageal Reflux    HPI Juan Esparza is a 44 y.o. male.   Patient presents to clinic over concern of a central chest pain that he describes as a burning sensation that is worse after he eats or drinks.  He is also having a dry cough.  Does have a history of acid reflux and takes omeprazole  daily.  Has not had any fevers or recent illnesses.  Has not had alcohol in the past few days.  Per wife patient has not been complaint w/ omeprazole  and has been eating a lot of spaghetti.   The history is provided by the patient and medical records.  Cough Gastroesophageal Reflux    Past Medical History:  Diagnosis Date   Alcoholism (HCC)    Anxiety    Depression    GERD (gastroesophageal reflux disease)    Sleep apnea     Patient Active Problem List   Diagnosis Date Noted   Current moderate episode of major depressive disorder (HCC) 07/07/2024   GAD (generalized anxiety disorder) 07/07/2024   Bilateral low back pain without sciatica 05/20/2024   Hyperglycemia 07/11/2023   Erectile dysfunction 06/13/2023   Seasonal allergic rhinitis due to pollen 06/13/2023   Snoring 06/13/2023   Chronic insomnia 12/25/2020   Anxiety and depression 12/25/2020   Seasonal allergies 12/25/2020   Alcohol use disorder, mild, abuse 04/27/2020   GERD (gastroesophageal reflux disease) 11/11/2019   Chronic diarrhea 11/11/2019    Past Surgical History:  Procedure Laterality Date   NO PAST SURGERIES         Home Medications    Prior to Admission medications  Medication Sig Start Date End Date Taking? Authorizing Provider  albuterol  (VENTOLIN  HFA) 108 (90 Base) MCG/ACT inhaler Inhale 2 puffs into the lungs every 6 (six) hours as needed for wheezing or shortness of breath (Cough). 07/11/23  Yes [name], MD   cyclobenzaprine  (FLEXERIL ) 5 MG tablet Take 1 tablet (5 mg total) by mouth at bedtime as needed. 03/20/24  Yes [name], PA-C  fluticasone  (FLONASE ) 50 MCG/ACT nasal spray Place 1 spray into both nostrils daily. Begin by using 2 sprays in each nare daily for 3 to 5 days, then decrease to 1 spray in each nare daily. 06/13/23  Yes [name], MD  hydrochlorothiazide  (HYDRODIURIL ) 25 MG tablet Take 1 tablet (25 mg total) by mouth daily. 09/07/24  Yes Leavy Lucas Fox, PA-C  hydrOXYzine  (VISTARIL ) 25 MG capsule Take 1 capsule (25 mg total) by mouth at bedtime as needed. 07/11/23  Yes [name], MD  omeprazole  (PRILOSEC) 40 MG capsule Take 1 capsule (40 mg total) by mouth daily. 07/22/24  Yes McGreal, [name] HERO, MD  QUEtiapine  (SEROQUEL ) 50 MG tablet Take 1 tablet (50 mg total) by mouth at bedtime. 06/09/24  Yes Kapoor, Sahil, MD  sildenafil  (VIAGRA ) 50 MG tablet Take 1-2 tablets (50-100 mg total) by mouth daily as needed for erectile dysfunction. 07/26/24  Yes Leavy Lucas Fox, PA-C  tiZANidine  (ZANAFLEX ) 4 MG tablet Take 1 tablet (4 mg total) by mouth at bedtime. 10/06/22  Yes [name], PA-C  traZODone  (DESYREL ) 100 MG tablet Take 1 tablet (100 mg total) by mouth at bedtime as needed. 06/09/24  Yes Kapoor, Sahil, MD  acamprosate  (CAMPRAL ) 333 MG tablet Take 2  tablets (666 mg total) by mouth 3 (three) times daily. 07/11/23   [name], MD  celecoxib (CELEBREX) 100 MG capsule TAKE 1 CAPSULE TWICE A DAY BY ORAL ROUTE AS NEEDED FOR 30 DAYS, FOR PAIN.    [provider]  FLUoxetine  (PROZAC ) 10 MG capsule Take 1 capsule (10 mg total) by mouth daily for 15 days, THEN 2 capsules (20 mg total) daily for 15 days. 06/09/24 09/07/24  Kapoor, Sahil, MD  ketorolac  (TORADOL ) 10 MG tablet Take 1 tablet (10 mg total) by mouth every 6 (six) hours as needed (pain). 07/06/24   Vonna Sharlet POUR, MD  naltrexone  (DEPADE) 50 MG tablet Take 1 tablet (50 mg total) by mouth daily. 07/07/24   Kapoor, Sahil,  MD  Olopatadine  HCl (PATADAY ) 0.2 % SOLN Apply 1 drop to eye daily. 06/13/23   [name], MD  oseltamivir  (TAMIFLU ) 75 MG capsule Take 1 capsule (75 mg total) by mouth 2 (two) times daily. 08/30/24   Vivienne Delon HERO, PA-C    Family History Family History  Problem Relation Age of Onset   Asthma Mother    Depression Mother    Stroke Mother    GER disease Mother    Hiatal hernia Mother    Alcohol abuse Father    Drug abuse Father    Anuerysm Father        brain   Asthma Sister    Anxiety disorder Sister    Heart failure Maternal Uncle        x 2 uncles   Diabetes Maternal Grandmother    Diabetes Paternal Grandmother    Lung cancer Paternal Grandfather    Colon cancer Neg Hx    Rectal cancer Neg Hx    Stomach cancer Neg Hx    Esophageal cancer Neg Hx     Social History Social History[1]   Allergies   Patient has no known allergies.   Review of Systems Review of Systems  Per HPI  Physical Exam Triage Vital Signs ED Triage Vitals  Encounter Vitals Group     BP 09/23/24 1334 139/88     Girls Systolic BP Percentile --      Girls Diastolic BP Percentile --      Boys Systolic BP Percentile --      Boys Diastolic BP Percentile --      Pulse Rate 09/23/24 1334 100     Resp 09/23/24 1334 20     Temp 09/23/24 1334 98.6 F (37 C)     Temp Source 09/23/24 1334 Oral     SpO2 09/23/24 1334 97 %     Weight --      Height --      Head Circumference --      Peak Flow --      Pain Score 09/23/24 1338 0     Pain Loc --      Pain Education --      Exclude from Growth Chart --    No data found.  Updated Vital Signs BP 139/88 (BP Location: Left Arm)   Pulse 100   Temp 98.6 F (37 C) (Oral)   Resp 20   SpO2 97%   Visual Acuity Right Eye Distance:   Left Eye Distance:   Bilateral Distance:    Right Eye Near:   Left Eye Near:    Bilateral Near:     Physical Exam Vitals and nursing note reviewed.  Constitutional:      Appearance: Normal  appearance.  HENT:     Head: Normocephalic and atraumatic.     Right Ear: External ear normal.     Left Ear: External ear normal.     Nose: Nose normal.     Mouth/Throat:     Mouth: Mucous membranes are moist.  Eyes:     Conjunctiva/sclera: Conjunctivae normal.  Cardiovascular:     Rate and Rhythm: Normal rate and regular rhythm.     Heart sounds: Normal heart sounds. No murmur heard. Pulmonary:     Effort: Pulmonary effort is normal. No respiratory distress.     Breath sounds: Normal breath sounds. No wheezing.  Skin:    General: Skin is warm and dry.  Neurological:     General: No focal deficit present.     Mental Status: He is alert.  Psychiatric:        Mood and Affect: Mood normal.      UC Treatments / Results  Labs (all labs ordered are listed, but only abnormal results are displayed) Labs Reviewed - No data to display  EKG   Radiology No results found.  Procedures Procedures (including critical care time)  Medications Ordered in UC Medications  alum & mag hydroxide-simeth (MAALOX/MYLANTA) 200-200-20 MG/5ML suspension 30 mL (30 mLs Oral Given 09/23/24 1412)  lidocaine  (XYLOCAINE ) 2 % viscous mouth solution 15 mL (15 mLs Mouth/Throat Given 09/23/24 1412)    Initial Impression / Assessment and Plan / UC Course  I have reviewed the triage vital signs and the nursing notes.  Pertinent labs & imaging results that were available during my care of the patient were reviewed by me and considered in my medical decision making (see chart for details).  Vitals and triage reviewed, patient is hemodynamically stable.  Lungs vesicular, heart with regular rate and rhythm.  Symptoms improved after GI cocktail.  EKG shows a rate of 102 bpm, EKG without significant changes when compared to previous.  Suspect GERD.  Encouraged omeprazole  compliance, will increase to twice daily.  Dietary modifications discussed.  Plan of care, follow-up care, and return precautions given, no  questions at this time.     Final Clinical Impressions(s) / UC Diagnoses   Final diagnoses:  Gastroesophageal reflux disease, unspecified whether esophagitis present     Discharge Instructions      Take the omeprazole  twice daily for the next 30 days Avoid fried foods, spicy foods, alcohol and acidic foods such as tomatoes or citrus Follow-up with your primary care provider within the next 30 days for reevaluation  Seek immediate care at the nearest emergency department if you develop any new concerning symptoms     ED Prescriptions   None    PDMP not reviewed this encounter.     [1]  Social History Tobacco Use   Smoking status: Former    Current packs/day: 0.00    Average packs/day: 0.5 packs/day for 2.0 years (1.0 ttl pk-yrs)    Types: Cigarettes    Start date: 2019    Quit date: 2021    Years since quitting: 5.0   Smokeless tobacco: Never  Vaping Use   Vaping status: Some Days  Substance Use Topics   Alcohol use: Yes    Alcohol/week: 28.0 standard drinks of alcohol    Types: 28 Cans of beer per week    Comment: 2- 3 beers/day   Drug use: No     Mercer Azaiah Mello  G, FNP 09/23/24 1534  "

## 2024-09-28 ENCOUNTER — Ambulatory Visit (HOSPITAL_COMMUNITY)
Admission: EM | Admit: 2024-09-28 | Discharge: 2024-09-28 | Disposition: A | Discharge status: Home / Self Care | Attending: Emergency Medicine | Admitting: Emergency Medicine

## 2024-09-28 ENCOUNTER — Ambulatory Visit: Payer: Self-pay

## 2024-09-28 ENCOUNTER — Ambulatory Visit (INDEPENDENT_AMBULATORY_CARE_PROVIDER_SITE_OTHER)

## 2024-09-28 DIAGNOSIS — R058 Other specified cough: Secondary | ICD-10-CM

## 2024-09-28 DIAGNOSIS — K219 Gastro-esophageal reflux disease without esophagitis: Secondary | ICD-10-CM

## 2024-09-28 DIAGNOSIS — R0789 Other chest pain: Secondary | ICD-10-CM

## 2024-09-28 MED ORDER — FAMOTIDINE 20 MG PO TABS: 20.0000 mg | ORAL_TABLET | Freq: Two times a day (BID) | ORAL | 0 refills | Status: AC

## 2024-09-28 MED ORDER — LIDOCAINE VISCOUS HCL 2 % MT SOLN
OROMUCOSAL | Status: AC
  Filled 2024-09-28: qty 15

## 2024-09-28 MED ORDER — ALUMINUM-MAGNESIUM-SIMETHICONE 200-200-20 MG/5ML PO SUSP: 30.0000 mL | Freq: Three times a day (TID) | ORAL | 0 refills | Status: AC

## 2024-09-28 MED ORDER — ALUM & MAG HYDROXIDE-SIMETH 200-200-20 MG/5ML PO SUSP
30.0000 mL | Freq: Once | ORAL | Status: AC
  Administered 2024-09-28: 30 mL via ORAL

## 2024-09-28 MED ORDER — ALUM & MAG HYDROXIDE-SIMETH 200-200-20 MG/5ML PO SUSP
ORAL | Status: AC
  Filled 2024-09-28: qty 30

## 2024-09-28 MED ORDER — LIDOCAINE VISCOUS HCL 2 % MT SOLN
15.0000 mL | Freq: Once | OROMUCOSAL | Status: AC
  Administered 2024-09-28: 15 mL via OROMUCOSAL

## 2024-09-28 NOTE — ED Triage Notes (Signed)
 Pt present with chest pain x 1 week. Pt states when he eats and dinks he has burning in chest. Pt states he has not felt better and still has burning sensation.

## 2024-09-28 NOTE — Discharge Instructions (Addendum)
" °  Since you have not had good response on the proton pump inhibitor (omeprazole ), I am switching you to a different medicine, Pepcid .  Take this twice daily before meals for the next 30 days.  You can use the Mylanta with meals as well to see if this helps coat your stomach.  Ultimately, it would be best if you followed up with your gastroenterologist who they can consider advanced imaging as needed.  Continue to abstain from alcohol and acidic foods. Smaller more frequent meals. Stay upright after meals as well.   Seek immediate care at the nearest emergency department for severe chest pain, uncontrolled vomiting, or new concerning symptoms.  "

## 2024-09-28 NOTE — Telephone Encounter (Signed)
 FYI Only or Action Required?: FYI only for provider: ED advised. Agreeable to go  Patient was last seen in primary care on 09/07/2024 by Juan Esparza, Sula, PA-C.  Called Nurse Triage reporting Gastroesophageal Reflux.  Symptoms began several weeks ago.  Interventions attempted: Prescription medications: Omeprazole .  Symptoms are: gradually worsening.  Triage Disposition: Go to ED Now (Notify PCP)  Patient/caregiver understands and will follow disposition?: Yes     2 weeks ago onset of burning heavy 7/10 pain in center of chest, throat and upper mid abdomen only after eating and drinking. Gradually goes away when done eating/drinking. Lasts 15-20 minutes. Radiates to back and both arms. Recently injured back, unsure if this is related. Hx of acid reflux.  Went to UC on 1/22 for acid reflux symptoms, advised to take omeprazole  at that time. Started taking omeprazole  which normally resolves his acid reflux but this time has not helped. No SOB currently but does reports some SOB at night for past 2 weeks. Pt worried it may be cardiac related given hx of hrtn and family hx of heart disease. Triage cut short d/t severity of pt symptoms. Advised ED, agreeable to go and has someone that can take him. Advised 911 for worsening symptoms.     Message from Kendralyn S sent at 09/28/2024  1:05 PM EST  Reason for Triage: [phone] , burning in chest happening for 2 weeks, acid reflux meds not helping   Reason for Disposition  Difficulty breathing  Answer Assessment - Initial Assessment Questions 1. LOCATION: Where does it hurt?       Center of chest  2. RADIATION: Does the pain go anywhere else? (e.g., into neck, jaw, arms, back)     Throat and mid upper abdomen. Back and both arms.  3. ONSET: When did the chest pain begin? (Minutes, hours or days)      2 weeks ago  4. PATTERN: Does the pain come and go, or has it been constant since it started?  Does it get worse with  exertion?       Only after eating and drinking. Gradually goes away when done eating/drinking.  5. DURATION: How long does it last (e.g., seconds, minutes, hours)     15-20 minutes  6. SEVERITY: How bad is the pain?  (e.g., Scale 1-10; mild, moderate, or severe)     7/10  7. CARDIAC RISK FACTORS: Do you have any history of heart problems or risk factors for heart disease? (e.g., angina, prior heart attack; diabetes, high blood pressure, high cholesterol, smoker, or strong family history of heart disease)     Htn, family hx of heart disease  8. PULMONARY RISK FACTORS: Do you have any history of lung disease?  (e.g., blood clots in lung, asthma, emphysema, birth control pills)     *No Answer*  9. CAUSE: What do you think is causing the chest pain?     Cardiac issue  10. OTHER SYMPTOMS: Do you have any other symptoms? (e.g., dizziness, nausea, vomiting, sweating, fever, difficulty breathing, cough)       SOB at night  Protocols used: Chest Pain-A-AH

## 2024-09-28 NOTE — ED Provider Notes (Signed)
 " [facility]    CSN: [id number] Arrival date & time: 09/28/24  1427      History   Chief Complaint Chief Complaint  Patient presents with   Chest Pain    HPI Juan Esparza is a 44 y.o. male.   Patient presents to clinic over concern of central chest pain and burning for the past week.  Symptoms are triggered with eating and drinking.  Patient was seen 1/22 for similar symptoms and given a GI cocktail and encouraged compliance with Prilosec, increased from once daily to twice daily.  Patient is concerned that he may have a lung infection due to a bad dry cough.  Has not had alcohol since his last visit, history of alcoholism.  Denies fever or trouble breathing.  He is unsure if the GI cocktail has been effective as he cannot tell until he eats or drinks. Does have a PCP but has not seen them recently. Had colonoscopy last year.   The history is provided by the patient and medical records.  Chest Pain   Past Medical History:  Diagnosis Date   Alcoholism (HCC)    Anxiety    Depression    GERD (gastroesophageal reflux disease)    Sleep apnea     Patient Active Problem List   Diagnosis Date Noted   Current moderate episode of major depressive disorder (HCC) 07/07/2024   GAD (generalized anxiety disorder) 07/07/2024   Bilateral low back pain without sciatica 05/20/2024   Hyperglycemia 07/11/2023   Erectile dysfunction 06/13/2023   Seasonal allergic rhinitis due to pollen 06/13/2023   Snoring 06/13/2023   Chronic insomnia 12/25/2020   Anxiety and depression 12/25/2020   Seasonal allergies 12/25/2020   Alcohol use disorder, mild, abuse 04/27/2020   GERD (gastroesophageal reflux disease) 11/11/2019   Chronic diarrhea 11/11/2019    Past Surgical History:  Procedure Laterality Date   NO PAST SURGERIES         Home Medications    Prior to Admission medications  Medication Sig Start Date End Date Taking? Authorizing Provider  aluminum -magnesium   hydroxide-simethicone  (MAALOX) 200-200-20 MG/5ML SUSP Take 30 mLs by mouth 4 (four) times daily -  before meals and at bedtime. 09/28/24  Yes Ball, Rue Tinnel  G, FNP  famotidine  (PEPCID ) 20 MG tablet Take 1 tablet (20 mg total) by mouth 2 (two) times daily. 09/28/24 10/28/24 Yes Ball, Lazette Estala  G, FNP  acamprosate  (CAMPRAL ) 333 MG tablet Take 2 tablets (666 mg total) by mouth 3 (three) times daily. 07/11/23   [name], MD  albuterol  (VENTOLIN  HFA) 108 [phone] Base) MCG/ACT inhaler Inhale 2 puffs into the lungs every 6 (six) hours as needed for wheezing or shortness of breath (Cough). 07/11/23   [name], MD  celecoxib (CELEBREX) 100 MG capsule TAKE 1 CAPSULE TWICE A DAY BY ORAL ROUTE AS NEEDED FOR 30 DAYS, FOR PAIN.    [provider]  cyclobenzaprine  (FLEXERIL ) 5 MG tablet Take 1 tablet (5 mg total) by mouth at bedtime as needed. 03/20/24   [name], PA-C  FLUoxetine  (PROZAC ) 10 MG capsule Take 1 capsule (10 mg total) by mouth daily for 15 days, THEN 2 capsules (20 mg total) daily for 15 days. 06/09/24 09/07/24  Kapoor, Sahil, MD  fluticasone  (FLONASE ) 50 MCG/ACT nasal spray Place 1 spray into both nostrils daily. Begin by using 2 sprays in each nare daily for 3 to 5 days, then decrease to 1 spray in each nare daily. 06/13/23   [name], MD  hydrochlorothiazide  (HYDRODIURIL ) 25 MG tablet Take 1 tablet (25 mg total) by mouth daily. 09/07/24   Leavy Lucas Fox, PA-C  hydrOXYzine  (VISTARIL ) 25 MG capsule Take 1 capsule (25 mg total) by mouth at bedtime as needed. 07/11/23   [name], MD  ketorolac  (TORADOL ) 10 MG tablet Take 1 tablet (10 mg total) by mouth every 6 (six) hours as needed (pain). 07/06/24   Vonna Sharlet POUR, MD  naltrexone  (DEPADE) 50 MG tablet Take 1 tablet (50 mg total) by mouth daily. 07/07/24   Kapoor, Sahil, MD  Olopatadine  HCl (PATADAY ) 0.2 % SOLN Apply 1 drop to eye daily. 06/13/23   [name], MD  omeprazole  (PRILOSEC) 40 MG capsule Take 1 capsule (40 mg  total) by mouth daily. 07/22/24   Suzann [name] HERO, MD  oseltamivir  (TAMIFLU ) 75 MG capsule Take 1 capsule (75 mg total) by mouth 2 (two) times daily. 08/30/24   Vivienne Delon HERO, PA-C  QUEtiapine  (SEROQUEL ) 50 MG tablet Take 1 tablet (50 mg total) by mouth at bedtime. 06/09/24   Kapoor, Sahil, MD  sildenafil  (VIAGRA ) 50 MG tablet Take 1-2 tablets (50-100 mg total) by mouth daily as needed for erectile dysfunction. 07/26/24   Leavy Lucas Fox, PA-C  tiZANidine  (ZANAFLEX ) 4 MG tablet Take 1 tablet (4 mg total) by mouth at bedtime. 10/06/22   [name], PA-C  traZODone  (DESYREL ) 100 MG tablet Take 1 tablet (100 mg total) by mouth at bedtime as needed. 06/09/24   Kapoor, Sahil, MD    Family History Family History  Problem Relation Age of Onset   Asthma Mother    Depression Mother    Stroke Mother    GER disease Mother    Hiatal hernia Mother    Alcohol abuse Father    Drug abuse Father    Anuerysm Father        brain   Asthma Sister    Anxiety disorder Sister    Heart failure Maternal Uncle        x 2 uncles   Diabetes Maternal Grandmother    Diabetes Paternal Grandmother    Lung cancer Paternal Grandfather    Colon cancer Neg Hx    Rectal cancer Neg Hx    Stomach cancer Neg Hx    Esophageal cancer Neg Hx     Social History Social History[1]   Allergies   Patient has no known allergies.   Review of Systems Review of Systems  Per HPI  Physical Exam Triage Vital Signs ED Triage Vitals  Encounter Vitals Group     BP 09/28/24 1556 (!) 137/98     Girls Systolic BP Percentile --      Girls Diastolic BP Percentile --      Boys Systolic BP Percentile --      Boys Diastolic BP Percentile --      Pulse Rate 09/28/24 1554 90     Resp 09/28/24 1554 18     Temp 09/28/24 1554 98.4 F (36.9 C)     Temp src --      SpO2 09/28/24 1554 97 %     Weight --      Height --      Head Circumference --      Peak Flow --      Pain Score 09/28/24 1554 0     Pain Loc --       Pain Education --      Exclude from Growth Chart --    No  data found.  Updated Vital Signs BP (!) 137/98   Pulse 90   Temp 98.4 F (36.9 C)   Resp 18   SpO2 97%   Visual Acuity Right Eye Distance:   Left Eye Distance:   Bilateral Distance:    Right Eye Near:   Left Eye Near:    Bilateral Near:     Physical Exam Vitals and nursing note reviewed.  Constitutional:      Appearance: Normal appearance.  HENT:     Head: Normocephalic and atraumatic.     Right Ear: External ear normal.     Left Ear: External ear normal.     Nose: Nose normal.     Mouth/Throat:     Mouth: Mucous membranes are moist.  Eyes:     Conjunctiva/sclera: Conjunctivae normal.  Cardiovascular:     Rate and Rhythm: Normal rate and regular rhythm.     Heart sounds: Normal heart sounds. No murmur heard. Pulmonary:     Effort: Pulmonary effort is normal. No respiratory distress.     Breath sounds: Normal breath sounds.  Skin:    General: Skin is warm and dry.  Neurological:     General: No focal deficit present.     Mental Status: He is alert.  Psychiatric:        Mood and Affect: Mood normal.      UC Treatments / Results  Labs (all labs ordered are listed, but only abnormal results are displayed) Labs Reviewed - No data to display  EKG   Radiology No results found.  Procedures Procedures (including critical care time)  Medications Ordered in UC Medications  alum & mag hydroxide-simeth (MAALOX/MYLANTA) 200-200-20 MG/5ML suspension 30 mL (30 mLs Oral Given 09/28/24 1615)  lidocaine  (XYLOCAINE ) 2 % viscous mouth solution 15 mL (15 mLs Mouth/Throat Given 09/28/24 1615)    Initial Impression / Assessment and Plan / UC Course  I have reviewed the triage vital signs and the nursing notes.  Pertinent labs & imaging results that were available during my care of the patient were reviewed by me and considered in my medical decision making (see chart for details).  Vitals and triage reviewed,  patient is hemodynamically stable.  Lungs vesicular, heart with regular rate and rhythm.  GI cocktail given in clinic.  Symptoms consistent with acid reflux.  EKG shows a rate of 98 bpm, without ST elevation or ST depression.  Ongoing dry cough consistent with GERD.  Chest x-ray by my interpretation does not show acute abnormality, awaiting official radiology overread.  Will trial H2RA instead of PPI.  Maalox before meals.  GI follow-up encouraged.  Strict emergency precautions given if symptoms evolve or worsen.  Plan of care, follow-up care, and return precautions given, no questions at this time.    Final Clinical Impressions(s) / UC Diagnoses   Final diagnoses:  Dry cough  Gastroesophageal reflux disease, unspecified whether esophagitis present  Burning in the chest     Discharge Instructions       Since you have not had good response on the proton pump inhibitor (omeprazole ), I am switching you to a different medicine, Pepcid .  Take this twice daily before meals for the next 30 days.  You can use the Mylanta with meals as well to see if this helps coat your stomach.  Ultimately, it would be best if you followed up with your gastroenterologist who they can consider advanced imaging as needed.  Continue to abstain from alcohol and acidic foods. Smaller  more frequent meals. Stay upright after meals as well.   Seek immediate care at the nearest emergency department for severe chest pain, uncontrolled vomiting, or new concerning symptoms.      ED Prescriptions     Medication Sig Dispense Auth. Provider   famotidine  (PEPCID ) 20 MG tablet Take 1 tablet (20 mg total) by mouth 2 (two) times daily. 60 tablet Ball, Caylor Tallarico  G, FNP   aluminum -magnesium  hydroxide-simethicone  (MAALOX) 200-200-20 MG/5ML SUSP Take 30 mLs by mouth 4 (four) times daily -  before meals and at bedtime. 1,680 mL Ball, Domenica Weightman  G, FNP      PDMP not reviewed this encounter.     [1]  Social History Tobacco  Use   Smoking status: Former    Current packs/day: 0.00    Average packs/day: 0.5 packs/day for 2.0 years (1.0 ttl pk-yrs)    Types: Cigarettes    Start date: 2019    Quit date: 2021    Years since quitting: 5.0   Smokeless tobacco: Never  Vaping Use   Vaping status: Some Days  Substance Use Topics   Alcohol use: Yes    Alcohol/week: 28.0 standard drinks of alcohol    Types: 28 Cans of beer per week    Comment: 2- 3 beers/day   Drug use: No     Mercer Ailsa Mireles  G, FNP 09/28/24 1734  "

## 2024-09-29 ENCOUNTER — Telehealth (HOSPITAL_COMMUNITY)

## 2024-09-29 DIAGNOSIS — F411 Generalized anxiety disorder: Secondary | ICD-10-CM

## 2024-09-29 DIAGNOSIS — F321 Major depressive disorder, single episode, moderate: Secondary | ICD-10-CM

## 2024-09-29 DIAGNOSIS — F1010 Alcohol abuse, uncomplicated: Secondary | ICD-10-CM | POA: Diagnosis not present

## 2024-09-29 DIAGNOSIS — F5104 Psychophysiologic insomnia: Secondary | ICD-10-CM | POA: Diagnosis not present

## 2024-09-29 MED ORDER — NALTREXONE HCL 50 MG PO TABS: 50.0000 mg | ORAL_TABLET | Freq: Every day | ORAL | 0 refills | Status: AC

## 2024-09-29 MED ORDER — FLUOXETINE HCL 20 MG PO CAPS: 20.0000 mg | ORAL_CAPSULE | Freq: Every day | ORAL | 1 refills | Status: AC

## 2024-09-29 MED ORDER — TRAZODONE HCL 100 MG PO TABS: 100.0000 mg | ORAL_TABLET | Freq: Every evening | ORAL | 3 refills | Status: AC | PRN

## 2024-09-29 NOTE — Progress Notes (Signed)
 " Psychiatric Follow Up  Patient Identification: Juan Esparza MRN:  [id number] Date of Evaluation:  09/29/2024 Referral Source: Primary care provider Chief Complaint:   Chief Complaint  Patient presents with   Follow-up   Medication Refill   Visit Diagnosis:    ICD-10-CM   1. Alcohol use disorder, mild, abuse  F10.10 naltrexone  (DEPADE) 50 MG tablet    2. Chronic insomnia  F51.04 traZODone  (DESYREL ) 100 MG tablet    3. Current moderate episode of major depressive disorder, unspecified whether recurrent (HCC)  F32.1 FLUoxetine  (PROZAC ) 20 MG capsule    4. GAD (generalized anxiety disorder)  F41.1 FLUoxetine  (PROZAC ) 20 MG capsule      Televisit via video: I connected with patient on 09/29/24 at 11:00 AM EST by a video enabled telemedicine application and verified that I am speaking with the correct person using two identifiers.  Location: Patient: Home in [place] Provider: Clinic in [place]   I discussed the limitations of evaluation and management by telemedicine and the availability of in person appointments. The patient expressed understanding and agreed to proceed.  I discussed the assessment and treatment plan with the patient. The patient was provided an opportunity to ask questions and all were answered. The patient agreed with the plan and demonstrated an understanding of the instructions.   The patient was advised to call back or seek an in-person evaluation if the symptoms worsen or if the condition fails to improve as anticipated.    Assessment:  Juan Esparza is a 44 y.o. male with a history of anxiety, depression who presents in person to [facility] for follow up on 09/29/2024.    Today, patient has had episodes of depression and anxiety due to ongoing health concerns that primarily includes gastrointestinal reflux and pain, being off work, having back pain however he has been trying to cope with it well.  His alcohol use has decreased  from the previous visit, he has been sober for the past 1 week, encouraged him to continue sobriety and schedule therapy with assistance to developing skills for maintenance of the same.  His depression and anxiety symptoms have improved, he has been eating well, sleep has improved as well.  His energy has been poor due to the above-mentioned psychosocial stressors.  He has started taking Prozac  today, recommended him to be compliant on his medications, has been taking trazodone  every night.  Discussed sleep hygiene, recommended him to get blood work done in the clinic before starting naltrexone , schedule a visit next week.  He is not actively or passively suicidal and there are no safety concerns today.  He continues to have fair insight into his condition, poor coping skills and will strongly benefit from continued counseling/therapy.  Will continue same medication management, encouraged compliance, cessation from alcohol, will have him back in the clinic in 6 to 8 weeks.  Risk Assessment: A suicide and violence risk assessment was performed as part of this evaluation. There patient is deemed to be at chronic elevated risk for self-harm/suicide given the following factors: N/A. These risk factors are mitigated by the following factors: lack of active SI/HI, no known access to weapons or firearms, no history of previous suicide attempts, and no history of violence. The patient is deemed to be at chronic elevated risk for violence given the following factors: N/A. These risk factors are mitigated by the following factors: no known history of violence towards others, no known violence towards others in the last 6 months,  no known history of threats of harm towards others, no known homicidal ideation in the last 6 months, no command hallucinations to harm others in the last 6 months, and no active symptoms of psychosis. There is no acute risk for suicide or violence at this time. The patient was educated about  relevant modifiable risk factors including following recommendations for treatment of psychiatric illness and abstaining from substance abuse.  While future psychiatric events cannot be accurately predicted, the patient does not currently require  acute inpatient psychiatric care and does not currently meet [facility]  involuntary commitment criteria.  Patient was given contact information for crisis resources, behavioral health clinic and was instructed to call 911 for emergencies.    Plan: # MDD without psychotic features Past medication trials: Trazodone  Status of problem: Current Interventions: -- Continue Prozac  20 mg daily for mood -- Continue trazodone  100 mg nightly for sleep, we will taper it down in the upcoming visit -- Continue Seroquel  50 mg as needed nightly for sleep -- Can consider gabapentin  in future if no/minimal benefit with the current regimen  # GAD Past medication trials: Trazodone  Status of problem: Current Interventions: -- Start Prozac  as above  # AUD Past medication trials: None Status of problem: Active Interventions: --Reschedule therapy in the clinic for alcohol use -- Patient is currently contemplative -- Naltrexone  50 mg daily, hold until blood work done, scheduled an appointment -- Ordered CMP, CBC, TSH, folate and vitamin B12 levels.   History of Present Illness:    Juan Esparza is a 44 year old male with a history of insomnia, depression, anxiety, alcohol use disorder that presented to the clinic today for follow-up for depression and anxiety. Today, patient was seen virtually.  He reported his mood as kind of moody, reported that he has been dealing with gastrointestinal issues, having regurgitation, gastrointestinal pain.  Stated that he was with his doctor yesterday and they gave him a different medication for reflux.  Reported that he has been feeling better now I will get all my medicines now, I will take all my medications .  Reported  that he has not been taking all his medicines until yesterday.  When asked about his appetite he stated eating well, appetite good, I am eating and small portions .  When asked about sleep he stated a little better .  He denied any nightmares or flashbacks.  He denied any active or passive SI/HI/AVH.  Reported that he has been off alcohol for the past 1 week, denied any withdrawals, reported frequent cravings however stated that I have been fighting it .  When asked about his energy he stated kind of poor, a little better energy today .  Stated that he has been taking trazodone  at bedtime, have not taken any other medications currently.  When asked about depression he stated slightly improved , and anxiety doing better with my anxiety now, stated that after being off alcohol his symptoms have been better.  Counseled on continued alcohol cessation, maintaining sobriety and the effects of alcohol on his mood and anxiety.  Stated that I am still out of work, I got an injection for pain on Thursday , stated that that has impacted his mood at times however he has been coping with it well. We discussed about continuing the same medications, he reported he started taking Prozac  today, recommended him to increase it to 20 mg after 2 weeks.  Also encouraged him to get his blood work done, we will schedule a visit here.  He had started therapy, wanted to resume therapy after detox however since he has been off alcohol for the past 1 week, recommended him to reschedule therapy for development of skills for maintenance of sobriety.  Prescription sent to the preferred pharmacy, will have him back in the clinic in 6 to 8 weeks.  Associated Signs/Symptoms: Depression Symptoms:  depressed mood, insomnia, fatigue, difficulty concentrating, anxiety, loss of energy/fatigue, decreased appetite, (Hypo) Manic Symptoms:  None Anxiety Symptoms:  Excessive Worry, Psychotic Symptoms:  None PTSD  Symptoms: Negative  Past Psychiatric History:  Past psychiatric diagnoses: Anxiety, depression, Chronic insomnia, AUD Psychiatric hospitalizations: None Past suicide attempts: Denies Hx of self harm: Denies Hx of violence towards others: Denies Prior psychiatric providers: None Prior therapy: None Access to firearms: Denies  Prior medication trials: Trazodone  100 mg  Substance use: Alcohol, current, quit using marijuana 6 years ago  Past Medical History:  Past Medical History:  Diagnosis Date   Alcoholism (HCC)    Anxiety    Depression    GERD (gastroesophageal reflux disease)    Sleep apnea     Past Surgical History:  Procedure Laterality Date   NO PAST SURGERIES      Family Psychiatric History: His father suffered from depression Family History:  Family History  Problem Relation Age of Onset   Asthma Mother    Depression Mother    Stroke Mother    GER disease Mother    Hiatal hernia Mother    Alcohol abuse Father    Drug abuse Father    Anuerysm Father        brain   Asthma Sister    Anxiety disorder Sister    Heart failure Maternal Uncle        x 2 uncles   Diabetes Maternal Grandmother    Diabetes Paternal Grandmother    Lung cancer Paternal Grandfather    Colon cancer Neg Hx    Rectal cancer Neg Hx    Stomach cancer Neg Hx    Esophageal cancer Neg Hx     Social History:   Social History   Socioeconomic History   Marital status: Single    Spouse name: Not on file   Number of children: 1   Years of education: Not on file   Highest education level: Not on file  Occupational History    Employer: [employer]  Tobacco Use   Smoking status: Former    Current packs/day: 0.00    Average packs/day: 0.5 packs/day for 2.0 years (1.0 ttl pk-yrs)    Types: Cigarettes    Start date: 2019    Quit date: 2021    Years since quitting: 5.0   Smokeless tobacco: Never  Vaping Use   Vaping status: Some Days  Substance and Sexual Activity   Alcohol use: Yes     Alcohol/week: 28.0 standard drinks of alcohol    Types: 28 Cans of beer per week    Comment: 2- 3 beers/day   Drug use: No   Sexual activity: Yes    Partners: Female    Birth control/protection: None  Other Topics Concern   Not on file  Social History Narrative   Not on file   Social Drivers of Health   Tobacco Use: Medium Risk (09/23/2024)   Patient History    Smoking Tobacco Use: Former    Smokeless Tobacco Use: Never    Passive Exposure: Not on file  Financial Resource Strain: Medium Risk (04/19/2024)   Overall Financial Resource Strain (CARDIA)  Difficulty of Paying Living Expenses: Somewhat hard  Food Insecurity: Food Insecurity Present (04/19/2024)   Epic    Worried About Programme Researcher, Broadcasting/film/video in the Last Year: Sometimes true    Ran Out of Food in the Last Year: Sometimes true  Transportation Needs: No Transportation Needs (04/19/2024)   Epic    Lack of Transportation (Medical): No    Lack of Transportation (Non-Medical): No  Physical Activity: Insufficiently Active (04/19/2024)   Exercise Vital Sign    Days of Exercise per Week: 1 day    Minutes of Exercise per Session: 10 min  Stress: No Stress Concern Present (04/19/2024)   [employer] of Occupational Health - Occupational Stress Questionnaire    Feeling of Stress: Not at all  Social Connections: Moderately Isolated (04/19/2024)   Social Connection and Isolation Panel    Frequency of Communication with Friends and Family: Twice a week    Frequency of Social Gatherings with Friends and Family: Twice a week    Attends Religious Services: 1 to 4 times per year    Active Member of Clubs or Organizations: No    Attends Banker Meetings: Never    Marital Status: Never married  Depression (PHQ2-9): Low Risk (09/07/2024)   Depression (PHQ2-9)    PHQ-2 Score: 0  Recent Concern: Depression (PHQ2-9) - High Risk (08/24/2024)   Depression (PHQ2-9)    PHQ-2 Score: 20  Alcohol Screen: Low Risk (04/19/2024)    Alcohol Screen    Last Alcohol Screening Score (AUDIT): 1  Housing: Low Risk (04/19/2024)   Epic    Unable to Pay for Housing in the Last Year: No    Number of Times Moved in the Last Year: 0    Homeless in the Last Year: No  Utilities: Not At Risk (04/19/2024)   Epic    Threatened with loss of utilities: No  Health Literacy: Adequate Health Literacy (04/19/2024)   B1300 Health Literacy    Frequency of need for help with medical instructions: Never    Additional Social History: He is currently on disability, worked as a pension scheme manager.  Allergies:  No Known Allergies  Metabolic Disorder Labs: Lab Results  Component Value Date   HGBA1C 6.6 (H) 07/11/2023   MPG 143 07/11/2023   No results found for: PROLACTIN Lab Results  Component Value Date   CHOL 252 (H) 02/19/2022   TRIG 140.0 02/19/2022   HDL 72.50 02/19/2022   CHOLHDL 3 02/19/2022   VLDL 28.0 02/19/2022   LDLCALC 152 (H) 02/19/2022   LDLCALC 95 05/22/2020   Lab Results  Component Value Date   TSH 0.97 10/16/2023    Therapeutic Level Labs: No results found for: LITHIUM No results found for: CBMZ No results found for: VALPROATE  Current Medications: Current Outpatient Medications  Medication Sig Dispense Refill   acamprosate  (CAMPRAL ) 333 MG tablet Take 2 tablets (666 mg total) by mouth 3 (three) times daily. 180 tablet 2   albuterol  (VENTOLIN  HFA) 108 (90 Base) MCG/ACT inhaler Inhale 2 puffs into the lungs every 6 (six) hours as needed for wheezing or shortness of breath (Cough). 18 g 0   aluminum -magnesium  hydroxide-simethicone  (MAALOX) 200-200-20 MG/5ML SUSP Take 30 mLs by mouth 4 (four) times daily -  before meals and at bedtime. 1680 mL 0   celecoxib (CELEBREX) 100 MG capsule TAKE 1 CAPSULE TWICE A DAY BY ORAL ROUTE AS NEEDED FOR 30 DAYS, FOR PAIN.     cyclobenzaprine  (FLEXERIL ) 5 MG tablet Take 1  tablet (5 mg total) by mouth at bedtime as needed. 30 tablet 0   famotidine  (PEPCID ) 20 MG tablet  Take 1 tablet (20 mg total) by mouth 2 (two) times daily. 60 tablet 0   FLUoxetine  (PROZAC ) 20 MG capsule Take 1 capsule (20 mg total) by mouth daily. 30 capsule 1   fluticasone  (FLONASE ) 50 MCG/ACT nasal spray Place 1 spray into both nostrils daily. Begin by using 2 sprays in each nare daily for 3 to 5 days, then decrease to 1 spray in each nare daily. 15.8 mL 2   hydrochlorothiazide  (HYDRODIURIL ) 25 MG tablet Take 1 tablet (25 mg total) by mouth daily. 90 tablet 3   hydrOXYzine  (VISTARIL ) 25 MG capsule Take 1 capsule (25 mg total) by mouth at bedtime as needed. 30 capsule 6   ketorolac  (TORADOL ) 10 MG tablet Take 1 tablet (10 mg total) by mouth every 6 (six) hours as needed (pain). 20 tablet 0   naltrexone  (DEPADE) 50 MG tablet Take 1 tablet (50 mg total) by mouth daily. 30 tablet 0   Olopatadine  HCl (PATADAY ) 0.2 % SOLN Apply 1 drop to eye daily. 2.5 mL 1   omeprazole  (PRILOSEC) 40 MG capsule Take 1 capsule (40 mg total) by mouth daily. 90 capsule 3   oseltamivir  (TAMIFLU ) 75 MG capsule Take 1 capsule (75 mg total) by mouth 2 (two) times daily. 10 capsule 0   QUEtiapine  (SEROQUEL ) 50 MG tablet Take 1 tablet (50 mg total) by mouth at bedtime. 30 tablet 0   sildenafil  (VIAGRA ) 50 MG tablet Take 1-2 tablets (50-100 mg total) by mouth daily as needed for erectile dysfunction. 30 tablet 3   tiZANidine  (ZANAFLEX ) 4 MG tablet Take 1 tablet (4 mg total) by mouth at bedtime. 30 tablet 0   traZODone  (DESYREL ) 100 MG tablet Take 1 tablet (100 mg total) by mouth at bedtime as needed. 90 tablet 3   No current facility-administered medications for this visit.    Musculoskeletal: Strength & Muscle Tone: within normal limits Gait & Station: normal Patient leans: N/A  Psychiatric Specialty Exam:  Psychiatric Specialty Exam: There were no vitals taken for this visit.There is no height or weight on file to calculate BMI. Review of Systems  General Appearance: Casual and Fairly Groomed  Eye Contact:  Good   Speech:  Clear and Coherent  Volume:  Normal  Mood:  Euthymic  Affect:  Congruent  Thought Content: Logical   Suicidal Thoughts:  No  Homicidal Thoughts:  No  Thought Process:  Coherent  Orientation:  Full (Time, Place, and Person)    Memory: Immediate;   Good Recent;   Good Remote;   Good  Judgment:  Fair  Insight:  Fair  Concentration:  Concentration: Good and Attention Span: Good  Recall:  not formally assessed   Fund of Knowledge: Good  Language: Good  Psychomotor Activity:  Normal  Akathisia:  No  AIMS (if indicated): not done  Assets:  Communication Skills Desire for Improvement Financial Resources/Insurance Housing Transportation Vocational/Educational  ADL's:  Intact  Cognition: WNL  Sleep:  Fair    Screenings: GAD-7    Flowsheet Row Office Visit from 09/07/2024 in [facility] Office Visit from 06/09/2024 in [facility] Office Visit from 05/20/2024 in [facility]  Total GAD-7 Score 0 18 0   PHQ2-9    Flowsheet Row Office Visit from 09/07/2024 in [facility] Counselor from  08/24/2024 in [facility] at [facility] Visit from 06/09/2024 in [facility] Office Visit from 05/20/2024 in [facility] at [facility] Office Visit from 04/19/2024 in [facility] at [facility] Total Score 0 6 6 0 4  PHQ-9 Total Score 0 20 21 0 6   Flowsheet Row UC from 09/28/2024 in [facility] at [facility] UC from 09/23/2024 in [facility] at [facility] UC from 07/06/2024 in [facility] at [facility] RISK CATEGORY No Risk No Risk No Risk     Collaboration of Care: Other Dr. Mercy LEOS Notes  Patient/Guardian was advised Release of Information must be obtained prior to any record release in order to  collaborate their care with an outside provider. Patient/Guardian was advised if they have not already done so to contact the registration department to sign all necessary forms in order for us  to release information regarding their care.   Consent: Patient/Guardian gives verbal consent for treatment and assignment of benefits for services provided during this visit. Patient/Guardian expressed understanding and agreed to proceed.   Aarav Burgett, MD 1/28/202611:21 AM "

## 2024-09-29 NOTE — Addendum Note (Signed)
 Addended by: CARVIN CROCK on: 09/29/2024 11:48 AM   Modules accepted: Level of Service

## 2024-10-04 ENCOUNTER — Ambulatory Visit (HOSPITAL_COMMUNITY)

## 2024-10-07 ENCOUNTER — Ambulatory Visit (HOSPITAL_COMMUNITY)

## 2024-10-07 ENCOUNTER — Ambulatory Visit (HOSPITAL_COMMUNITY): Admitting: Licensed Clinical Social Worker

## 2024-10-07 NOTE — Progress Notes (Unsigned)
 The patient late cancels his appointment today and reschedules per the Reception desk with no information provided for the late cancel such that the Receptionist no shows this appointment.  Zell Maier, MA, LCSW, [facility], LCAS 10/07/2024

## 2024-10-08 ENCOUNTER — Ambulatory Visit: Payer: Self-pay

## 2024-10-11 ENCOUNTER — Ambulatory Visit

## 2024-10-18 ENCOUNTER — Ambulatory Visit (HOSPITAL_COMMUNITY)

## 2024-10-19 ENCOUNTER — Ambulatory Visit (HOSPITAL_COMMUNITY): Admitting: Licensed Clinical Social Worker

## 2024-11-24 ENCOUNTER — Ambulatory Visit (HOSPITAL_COMMUNITY)
